# Patient Record
Sex: Female | Born: 1985
Health system: Southern US, Community
[De-identification: ages and names within clinical notes are randomized; demographics above are authoritative.]

## PROBLEM LIST (undated history)

## (undated) DIAGNOSIS — M3500 Sicca syndrome, unspecified: Secondary | ICD-10-CM

## (undated) DIAGNOSIS — Z789 Other specified health status: Secondary | ICD-10-CM

## (undated) DIAGNOSIS — E559 Vitamin D deficiency, unspecified: Secondary | ICD-10-CM

## (undated) DIAGNOSIS — O479 False labor, unspecified: Secondary | ICD-10-CM

## (undated) HISTORY — DX: Vitamin D deficiency, unspecified: E55.9

## (undated) HISTORY — DX: Sjogren syndrome, unspecified: M35.00

---

## 2006-11-17 ENCOUNTER — Encounter (INDEPENDENT_AMBULATORY_CARE_PROVIDER_SITE_OTHER): Payer: Self-pay | Admitting: *Deleted

## 2006-11-17 LAB — CONVERTED CEMR LAB

## 2007-04-01 ENCOUNTER — Ambulatory Visit: Payer: Self-pay | Admitting: Family Medicine

## 2007-04-01 ENCOUNTER — Encounter (INDEPENDENT_AMBULATORY_CARE_PROVIDER_SITE_OTHER): Payer: Self-pay | Admitting: *Deleted

## 2007-04-01 DIAGNOSIS — J301 Allergic rhinitis due to pollen: Secondary | ICD-10-CM | POA: Insufficient documentation

## 2007-04-01 DIAGNOSIS — N390 Urinary tract infection, site not specified: Secondary | ICD-10-CM | POA: Insufficient documentation

## 2007-04-09 ENCOUNTER — Encounter (INDEPENDENT_AMBULATORY_CARE_PROVIDER_SITE_OTHER): Payer: Self-pay | Admitting: Family Medicine

## 2008-11-18 ENCOUNTER — Ambulatory Visit: Payer: Self-pay | Admitting: Family Medicine

## 2008-11-18 LAB — CONVERTED CEMR LAB: Rapid Strep: NEGATIVE

## 2009-05-14 ENCOUNTER — Ambulatory Visit: Payer: Self-pay | Admitting: Family Medicine

## 2009-05-14 DIAGNOSIS — L255 Unspecified contact dermatitis due to plants, except food: Secondary | ICD-10-CM | POA: Insufficient documentation

## 2009-11-02 ENCOUNTER — Ambulatory Visit (HOSPITAL_COMMUNITY): Admission: RE | Admit: 2009-11-02 | Discharge: 2009-11-02 | Payer: Self-pay | Admitting: Obstetrics and Gynecology

## 2009-12-25 ENCOUNTER — Inpatient Hospital Stay (HOSPITAL_COMMUNITY): Admission: AD | Admit: 2009-12-25 | Discharge: 2009-12-29 | Payer: Self-pay | Admitting: Obstetrics and Gynecology

## 2009-12-26 ENCOUNTER — Encounter (INDEPENDENT_AMBULATORY_CARE_PROVIDER_SITE_OTHER): Payer: Self-pay | Admitting: Obstetrics and Gynecology

## 2011-03-05 LAB — CBC
HCT: 31.3 % — ABNORMAL LOW (ref 36.0–46.0)
HCT: 36.3 % (ref 36.0–46.0)
Hemoglobin: 10.6 g/dL — ABNORMAL LOW (ref 12.0–15.0)
Hemoglobin: 12.1 g/dL (ref 12.0–15.0)
MCHC: 33.8 g/dL (ref 30.0–36.0)
MCV: 88.9 fL (ref 78.0–100.0)
Platelets: 158 K/uL (ref 150–400)
RBC: 3.52 MIL/uL — ABNORMAL LOW (ref 3.87–5.11)
RBC: 4.09 MIL/uL (ref 3.87–5.11)
RDW: 13 % (ref 11.5–15.5)
WBC: 12.5 K/uL — ABNORMAL HIGH (ref 4.0–10.5)
WBC: 14.6 10*3/uL — ABNORMAL HIGH (ref 4.0–10.5)

## 2011-03-05 LAB — COMPREHENSIVE METABOLIC PANEL
ALT: 14 U/L (ref 0–35)
AST: 20 U/L (ref 0–37)
Albumin: 2.6 g/dL — ABNORMAL LOW (ref 3.5–5.2)
Alkaline Phosphatase: 86 U/L (ref 39–117)
CO2: 22 mEq/L (ref 19–32)
Calcium: 9.2 mg/dL (ref 8.4–10.5)
Creatinine, Ser: 0.46 mg/dL (ref 0.4–1.2)
GFR calc Af Amer: >60 mL/min (ref >60)
Glucose, Bld: 82 mg/dL (ref 70–99)
Potassium: 3.8 mEq/L (ref 3.5–5.1)
Total Bilirubin: 0.4 mg/dL (ref 0.3–1.2)
Total Protein: 6.9 g/dL (ref 6.0–8.3)

## 2011-03-05 LAB — URINALYSIS, ROUTINE W REFLEX MICROSCOPIC
Bilirubin Urine: NEGATIVE
Glucose, UA: NEGATIVE mg/dL
Hgb urine dipstick: NEGATIVE
Ketones, ur: NEGATIVE mg/dL
Nitrite: NEGATIVE
Protein, ur: NEGATIVE mg/dL
Specific Gravity, Urine: 1.01 (ref 1.005–1.030)
Urobilinogen, UA: 0.2 mg/dL (ref 0.0–1.0)
pH: 7 (ref 5.0–8.0)

## 2011-03-05 LAB — URIC ACID: Uric Acid, Serum: 5 mg/dL (ref 2.4–7.0)

## 2011-06-01 LAB — RPR: RPR: NONREACTIVE

## 2011-06-01 LAB — ANTIBODY SCREEN: Antibody Screen: NEGATIVE

## 2011-06-15 ENCOUNTER — Other Ambulatory Visit: Payer: Self-pay | Admitting: Certified Nurse Midwife

## 2011-11-19 ENCOUNTER — Encounter (HOSPITAL_COMMUNITY): Payer: Self-pay | Admitting: *Deleted

## 2011-11-19 ENCOUNTER — Observation Stay (HOSPITAL_COMMUNITY)
Admission: AD | Admit: 2011-11-19 | Discharge: 2011-11-21 | Disposition: A | Discharge status: Home / Self Care | Payer: BC Managed Care – PPO | Source: Ambulatory Visit | Attending: Obstetrics and Gynecology | Admitting: Obstetrics and Gynecology

## 2011-11-19 DIAGNOSIS — O479 False labor, unspecified: Secondary | ICD-10-CM | POA: Diagnosis present

## 2011-11-19 DIAGNOSIS — O4700 False labor before 37 completed weeks of gestation, unspecified trimester: Principal | ICD-10-CM | POA: Insufficient documentation

## 2011-11-19 DIAGNOSIS — O99019 Anemia complicating pregnancy, unspecified trimester: Secondary | ICD-10-CM | POA: Insufficient documentation

## 2011-11-19 DIAGNOSIS — O34219 Maternal care for unspecified type scar from previous cesarean delivery: Secondary | ICD-10-CM | POA: Insufficient documentation

## 2011-11-19 DIAGNOSIS — D509 Iron deficiency anemia, unspecified: Secondary | ICD-10-CM | POA: Insufficient documentation

## 2011-11-19 HISTORY — DX: Other specified health status: Z78.9

## 2011-11-19 HISTORY — DX: False labor, unspecified: O47.9

## 2011-11-19 HISTORY — DX: False labor before 37 completed weeks of gestation, unspecified trimester: O47.00

## 2011-11-19 LAB — URINALYSIS, ROUTINE W REFLEX MICROSCOPIC
Bilirubin Urine: NEGATIVE
Glucose, UA: NEGATIVE mg/dL
Hgb urine dipstick: NEGATIVE
Ketones, ur: NEGATIVE mg/dL

## 2011-11-19 MED ORDER — LACTATED RINGERS IV SOLN
Freq: Once | INTRAVENOUS | Status: AC
  Administered 2011-11-19: via INTRAVENOUS

## 2011-11-19 MED ORDER — BETAMETHASONE SOD PHOS & ACET 6 (3-3) MG/ML IJ SUSP
12.0000 mg | INTRAMUSCULAR | Status: AC
  Administered 2011-11-20 (×2): 12 mg via INTRAMUSCULAR
  Filled 2011-11-19 (×2): qty 2

## 2011-11-19 MED ORDER — NIFEDIPINE 10 MG PO CAPS
20.0000 mg | ORAL_CAPSULE | Freq: Once | ORAL | Status: DC
  Administered 2011-11-19: 20 mg via ORAL
  Filled 2011-11-19: qty 2

## 2011-11-19 MED ORDER — ACETAMINOPHEN 325 MG PO TABS: 650.0000 mg | ORAL_TABLET | ORAL | Status: DC | PRN

## 2011-11-19 MED ORDER — NIFEDIPINE 10 MG PO CAPS
10.0000 mg | ORAL_CAPSULE | Freq: Four times a day (QID) | ORAL | Status: DC
  Filled 2011-11-19: qty 1

## 2011-11-19 MED ORDER — DOCUSATE SODIUM 100 MG PO CAPS
100.0000 mg | ORAL_CAPSULE | Freq: Every day | ORAL | Status: DC
  Administered 2011-11-20 – 2011-11-21 (×2): 100 mg via ORAL
  Filled 2011-11-19 (×3): qty 1

## 2011-11-19 MED ORDER — PRENATAL PLUS 27-1 MG PO TABS
1.0000 | ORAL_TABLET | Freq: Every day | ORAL | Status: DC
  Administered 2011-11-20 – 2011-11-21 (×2): 1 via ORAL
  Filled 2011-11-19 (×3): qty 1

## 2011-11-19 MED ORDER — ZOLPIDEM TARTRATE 10 MG PO TABS: 10.0000 mg | ORAL_TABLET | Freq: Every evening | ORAL | Status: DC | PRN

## 2011-11-19 MED ORDER — CALCIUM CARBONATE ANTACID 500 MG PO CHEW
2.0000 | CHEWABLE_TABLET | ORAL | Status: DC | PRN
  Filled 2011-11-19: qty 2

## 2011-11-19 NOTE — Progress Notes (Signed)
Pt reports contractions off/on for 2 days, tonight they were regular. G2P1( C/S for breech).

## 2011-11-19 NOTE — H&P (Signed)
  OB ADMISSION/ HISTORY & PHYSICAL:  Admission Date: 11/19/2011 10:35 PM  Admit Diagnosis: IUP at 30w 4d  Preterm contractions  Hx preterm birth 76 wks - C/S for breech  Veronica Hayes is a 25 y.o. female presenting for contractions which started earlier today, noted Q 4-8 min at home. Also w/ bout of diarrhea x 3 today, denies cramping / abdominal pain. States similar symptoms with previous preterm labor. On 17P weekly. Cvx at [redacted]w[redacted]d was closed and thick.   Prenatal History: G2P0101  EDC : 01/24/2012,   Prenatal care at [facility] since [redacted] weeks gestation  Prenatal course complicated by hx preterm birth and previous cesarean for breech  Prenatal Labs: ABO, Rh:   O pos Antibody:  neg Rubella:   immune RPR:   neg HBsAg:   neg HIV:   neg GBS:   unknown 1 hr Glucola : 112   Medical / Surgical History :  Past medical history:  Past Medical History  Diagnosis Date  . Preterm contractions 11/19/2011     Past surgical history: No past surgical history on file.   Family History: No family history on file.   Social History:  does not have a smoking history on file. She does not have any smokeless tobacco history on file. Her alcohol and drug histories not on file.   Allergies: Review of patient's allergies indicates no known allergies.    Current Medications at time of admission:  Current facility-administered medications:acetaminophen (TYLENOL) tablet 650 mg, 650 mg, Oral, Q4H PRN, Arlan Organ;  calcium carbonate (TUMS - dosed in mg elemental calcium) chewable tablet 400 mg of elemental calcium, 2 tablet, Oral, Q4H PRN, Arlan Organ;  docusate sodium (COLACE) capsule 100 mg, 100 mg, Oral, Daily, Arlan Organ;  lactated ringers infusion, , Intravenous, Once, Arlan Organ NIFEdipine (PROCARDIA) capsule 10 mg, 10 mg, Oral, Q6H, Arlan Organ;  NIFEdipine (PROCARDIA) capsule 20 mg, 20 mg, Oral, Once, Arlan Organ;  prenatal vitamin w/FE, FA (PRENATAL 1 + 1) 27-1 MG  tablet 1 tablet, 1 tablet, Oral, Daily, Arlan Organ;  zolpidem (AMBIEN) tablet 10 mg, 10 mg, Oral, QHS PRN, Arlan Organ    Review of Systems: ROS: as noted  Physical Exam:  Dilation: 1 Effacement (%): Thick Station: Ballotable Exam by::  Colon Flattery CNM Filed Vitals:   11/19/11 2243 11/19/11 2306  BP: 138/84 134/87  Pulse: 93 97  Temp: 99.4 F (37.4 C)   Resp: 18 18  Height: 5\' 3"  (1.6 m)   Weight: 68.947 kg (152 lb)    General: AAO X 3, NAD Heart: RRR Lungs:CTAB Abdomen:NT, gravid Extremities:no edema Genitalia / VE:as noted, fFN collected FHR: 130, (+) accel's, no decel's, moderate variability TOCO: Q3-4 min, x 50 sec, mild     Assessment: IUP at [redacted]w[redacted]d Preterm contractions with change in cervix UA negative, not c/w UTI fFN pending Plan:  Admit to antepartum for IV hydration and tocolytic given previous hx of PTB Betamethasone per protocol Procardia   Consult Dr Billy Coast - agrees  [facility] 11/19/2011, 11:35 PM

## 2011-11-20 ENCOUNTER — Inpatient Hospital Stay (HOSPITAL_COMMUNITY): Payer: BC Managed Care – PPO

## 2011-11-20 ENCOUNTER — Encounter (HOSPITAL_COMMUNITY): Payer: Self-pay | Admitting: *Deleted

## 2011-11-20 DIAGNOSIS — O34219 Maternal care for unspecified type scar from previous cesarean delivery: Secondary | ICD-10-CM | POA: Diagnosis present

## 2011-11-20 LAB — FETAL FIBRONECTIN: Fetal Fibronectin: NEGATIVE

## 2011-11-20 MED ORDER — NIFEDIPINE 10 MG PO CAPS
20.0000 mg | ORAL_CAPSULE | Freq: Four times a day (QID) | ORAL | Status: DC
  Administered 2011-11-20 – 2011-11-21 (×4): 20 mg via ORAL
  Filled 2011-11-20 (×4): qty 2

## 2011-11-20 MED ORDER — INDOMETHACIN 25 MG PO CAPS
25.0000 mg | ORAL_CAPSULE | Freq: Four times a day (QID) | ORAL | Status: DC
  Administered 2011-11-20 – 2011-11-21 (×4): 25 mg via ORAL
  Filled 2011-11-20 (×5): qty 1

## 2011-11-20 MED ORDER — LACTATED RINGERS IV SOLN
INTRAVENOUS | Status: DC
  Administered 2011-11-20 – 2011-11-21 (×4): via INTRAVENOUS

## 2011-11-20 MED ORDER — INDOMETHACIN 50 MG RE SUPP
50.0000 mg | Freq: Once | RECTAL | Status: DC
  Filled 2011-11-20: qty 1

## 2011-11-20 MED ORDER — NIFEDIPINE 10 MG PO CAPS
10.0000 mg | ORAL_CAPSULE | Freq: Four times a day (QID) | ORAL | Status: DC
  Administered 2011-11-20: 10 mg via ORAL

## 2011-11-20 MED ORDER — INDOMETHACIN 25 MG PO CAPS
25.0000 mg | ORAL_CAPSULE | Freq: Four times a day (QID) | ORAL | Status: DC
  Filled 2011-11-20: qty 1

## 2011-11-20 MED ORDER — INDOMETHACIN 50 MG RE SUPP
50.0000 mg | Freq: Once | RECTAL | Status: AC
  Administered 2011-11-20: 50 mg via RECTAL
  Filled 2011-11-20: qty 1

## 2011-11-20 MED ORDER — LACTATED RINGERS IV BOLUS (SEPSIS)
500.0000 mL | Freq: Once | INTRAVENOUS | Status: AC
  Administered 2011-11-20: 500 mL via INTRAVENOUS

## 2011-11-20 NOTE — Progress Notes (Signed)
Encouraged pt to void d/t regular UC

## 2011-11-20 NOTE — Progress Notes (Signed)
UR chart review completed.  

## 2011-11-20 NOTE — Progress Notes (Signed)
Patient ID: Veronica Hayes, female   DOB: 1986-11-09, 25 y.o.   MRN: [id number] S: Pt feels contractions. No bleeding or Leakage of fluid. No ROM. Slept intermittently.  O:VSS- afebrile Lungs: CTA CV: RRR Abd: Gravid, NT Neg CVAT Ext: Neg c/c/e Neuro: nonfocal Skin: intact Pelvic: 1/60/-2  FHR 150s with 15x15 accels No decels, Contractions every 5-7 min Category I tracing  A: 30 5/7 weeks IUP H/O PTD on 17OHP PTL - active contractions but no cervical change. FFN neg Previous Cesarean Section for rpt  P: Increase Procardia to 20 mg q 6 hours Add Indocin MgSO4 for Neuroprophylaxis Betamethasone #2 tonight Continuous EFM and Toco

## 2011-11-21 MED ORDER — NIFEDIPINE ER 30 MG PO TB24
30.0000 mg | ORAL_TABLET | Freq: Two times a day (BID) | ORAL | Status: DC
  Administered 2011-11-21: 30 mg via ORAL
  Filled 2011-11-21: qty 1

## 2011-11-21 MED ORDER — NIFEDIPINE ER 30 MG PO TB24: 30.0000 mg | ORAL_TABLET | Freq: Two times a day (BID) | ORAL | Status: DC

## 2011-11-21 NOTE — Progress Notes (Signed)
Pt feeling lightheaded

## 2011-11-21 NOTE — Progress Notes (Signed)
Patient ID: Veronica Hayes, female   DOB: 12-Jan-1986, 25 y.o.   MRN: [id number] S: Slept well, occasional ctx's still felt but v. Mild. Denies LOF / VB. Good FM.   O: BP 118/61  Pulse 102  Temp(Src) 98.3 F (36.8 C) (Oral)  Resp 18  Ht 5\' 3"  (1.6 m)  Wt 68.947 kg (152 lb)  BMI 26.93 kg/m2  Breastfeeding? Unknown   WUJ:WJXBJYNW: 125 bpm, Variability: Good {> 6 bpm), Accelerations: Reactive and Decelerations: Absent Toco: Frequency: 2-3 times per hour, Duration: 45-50 seconds and Intensity: mild SVE: deferred Sono w/ good interval growth, nl AFI, CL 5+ cm  A/P- 25 y.o. admitted with preterm contraction  Hx PTB at 34 wks, on 17OHP Hx C/S - breech, for repeat  Preterm labor management: IV hydration, nifedipine, indomethacin x 24 hrs Dating:  [redacted]w[redacted]d PNL Needed:  GBS cx pending FWB:  Category 1 PTL:  No progressive cervical change C/W Dr. Billy Coast Plan D/C home D/C indomethacin Procardia XL 30 mg PO BID Continue weekly 17OHP inj PTL precautions reviewed Follow closely in office  ROD: C-section with labor ("U" extension on uterus with prior C/S)  PAUL,DANIELA  11/21/2011 10:15 AM

## 2011-11-21 NOTE — Progress Notes (Signed)
Patient ID: Veronica Hayes, female   DOB: 1986/03/24, 25 y.o.   MRN: [id number] :S: Pt not feeling contractions.  No bleeding or Leakage of fluid. No ROM.  Slept well. Good FM  O:VSS- afebrile  Lungs: CTA  CV: RRR  Abd: Gravid, NT  Neg CVAT  Ext: Neg c/c/e  Neuro: nonfocal  Skin: intact  Pelvic: deferred  FHR 150s with 15x15 accels  No decels, Contractions every 5-10 min and irregular Category I tracing  A:  30 6/7 weeks IUP  H/O PTD on 17OHP . BMZ complete. PTL - active contractions but no cervical change. FFN neg  Previous Cesarean Section for rpt   P:  Change to Procardia XL DC home today. DC IV today. Weekly 17P Short term fu.

## 2011-11-21 NOTE — Discharge Summary (Signed)
Obstetric Discharge Summary Reason for Admission: observation/evaluation, 30w 6d preterm contractions Prenatal Procedures: ultrasound and 17OHP injections weekly Hospital course: Tocolytics given - Procardia and Indomethacin. Continuous EFM.  IV hydration. Betamethasone course completed. No progressive cervical change. Fetal fibronectin negative.    Hemoglobin  Date Value Range Status  12/27/2009 10.6* 12.0-15.0 (g/dL) Final     HCT  Date Value Range Status  12/27/2009 31.3* 36.0-46.0 (%) Final  GBS cx pending  Discharge Diagnoses: [redacted]w[redacted]d gest, preterm contractions,  Iron deficiency anemia of pregnancy - mild   Discharge Information: Date: 11/21/2011 Activity: increased rest at home Diet: routine, increase iron rich foods in diet Medications: PNV and Procardia XL Condition: stable Instructions: Continue 17OHP injections weekly, PTL precautions Discharge to: home Follow-up Information    Follow up with BAILEY,TANYA in 3 days.   Contact information:   [address] [place] [address] [phone]            Khadejah Son 11/21/2011, 10:19 AM

## 2011-11-22 LAB — CULTURE, BETA STREP (GROUP B ONLY)

## 2011-12-12 ENCOUNTER — Inpatient Hospital Stay (HOSPITAL_COMMUNITY): Payer: BC Managed Care – PPO

## 2011-12-12 ENCOUNTER — Inpatient Hospital Stay (HOSPITAL_COMMUNITY)
Admission: AD | Admit: 2011-12-12 | Discharge: 2011-12-12 | Disposition: A | Discharge status: Home / Self Care | Payer: BC Managed Care – PPO | Source: Ambulatory Visit | Attending: Obstetrics and Gynecology | Admitting: Obstetrics and Gynecology

## 2011-12-12 ENCOUNTER — Encounter (HOSPITAL_COMMUNITY): Payer: Self-pay | Admitting: *Deleted

## 2011-12-12 DIAGNOSIS — O4700 False labor before 37 completed weeks of gestation, unspecified trimester: Secondary | ICD-10-CM | POA: Insufficient documentation

## 2011-12-12 DIAGNOSIS — Z364 Encounter for antenatal screening for fetal growth retardation: Secondary | ICD-10-CM

## 2011-12-12 LAB — URINALYSIS, ROUTINE W REFLEX MICROSCOPIC
Bilirubin Urine: NEGATIVE
Ketones, ur: NEGATIVE mg/dL
Nitrite: NEGATIVE
Specific Gravity, Urine: 1.015 (ref 1.005–1.030)
Urobilinogen, UA: 0.2 mg/dL (ref 0.0–1.0)
pH: 7.5 (ref 5.0–8.0)

## 2011-12-12 LAB — URINE MICROSCOPIC-ADD ON

## 2011-12-12 MED ORDER — NIFEDIPINE 10 MG PO CAPS
10.0000 mg | ORAL_CAPSULE | Freq: Once | ORAL | Status: AC
  Administered 2011-12-12: 10 mg via ORAL
  Filled 2011-12-12: qty 2

## 2011-12-12 MED ORDER — NIFEDIPINE 10 MG PO CAPS
10.0000 mg | ORAL_CAPSULE | Freq: Once | ORAL | Status: AC
  Administered 2011-12-12: 10 mg via ORAL

## 2011-12-12 NOTE — ED Provider Notes (Signed)
History    25 yo G2P0101 MWF hx prev C/S now @ 33 6/[redacted] wk gestation presents with c/o painful reg cycle since 7 pm. Intact membrane. (-) vaginal bleeding. On 17OHP injection weekly. S/p admit 2 wk ago for PTL and given BMZ as well as med daily Chief Complaint  Patient presents with  . Contractions   HPI  OB History    Grav Para Term Preterm Abortions TAB SAB Ect Mult Living   2 1              Past Medical History  Diagnosis Date  . Preterm contractions 11/19/2011  . No pertinent past medical history   . Preterm labor     12/2009 34wks    Past Surgical History  Procedure Date  . Cesarean section     2011    History reviewed. No pertinent family history.  History  Substance Use Topics  . Smoking status: Never Smoker   . Smokeless tobacco: Never Used  . Alcohol Use: No    Allergies: No Known Allergies  Prescriptions prior to admission  Medication Sig Dispense Refill  . hydroxyprogesterone caproate (DELALUTIN) 250 mg/mL OIL Inject 250 mg into the muscle once a week.        Marland Kitchen NIFEdipine (PROCARDIA-XL/ADALAT CC) 30 MG 24 hr tablet Take 1 tablet (30 mg total) by mouth 2 (two) times daily.  60 tablet  0  . prenatal vitamin w/FE, FA (PRENATAL 1 + 1) 27-1 MG TABS Take 1 tablet by mouth daily.          ROS(+) ctx Physical Exam   Blood pressure 123/70, pulse 88, temperature 98.7 F (37.1 C), temperature source Oral, resp. rate 20, height 5\' 3"  (1.6 m), weight 70.308 kg (155 lb), unknown if currently breastfeeding.  Physical Exam  Constitutional: She is oriented to person, place, and time. She appears well-developed and well-nourished.  Eyes: EOM are normal.  Cardiovascular: Regular rhythm.   Respiratory: Breath sounds normal.  GI: Soft.       Gravid. Transverse incision  Musculoskeletal: She exhibits no edema.  Neurological: She is alert and oriented to person, place, and time.  Skin: Skin is warm and dry.  Psychiatric: She has a normal mood and affect.   Vulva (-)  lesion Vagina: (+) white d/c (+) mucus @ os Cervix 1.5cm/50/-3 vtx MAU Course  Procedures FFN done. U/a neg. ucx sent Tracing: baseline 130  Small accels NR ctx q q 4-6 mins   Assessment and Plan  PMC/PTL Hx PTB Prev C/S IUP @ 33 6/7 wk betamethasone complete P) FFN.  Procardia. BPP, EFW, cervical length. ucx  Veronica Hayes A 12/12/2011, 5:16 AM

## 2011-12-12 NOTE — Progress Notes (Signed)
Pt states she started contracting around noon yesterday, but since midnight have gotten harder and harder

## 2011-12-13 ENCOUNTER — Encounter (HOSPITAL_COMMUNITY): Payer: Self-pay | Admitting: *Deleted

## 2011-12-13 ENCOUNTER — Encounter (HOSPITAL_COMMUNITY): Payer: Self-pay | Admitting: Anesthesiology

## 2011-12-13 ENCOUNTER — Encounter (HOSPITAL_COMMUNITY)
Admission: AD | Disposition: A | Discharge status: Home / Self Care | Payer: Self-pay | Source: Ambulatory Visit | Attending: Obstetrics & Gynecology

## 2011-12-13 ENCOUNTER — Inpatient Hospital Stay (HOSPITAL_COMMUNITY)
Admission: AD | Admit: 2011-12-13 | Discharge: 2011-12-16 | DRG: 370 | Disposition: A | Discharge status: Home / Self Care | Payer: BC Managed Care – PPO | Source: Ambulatory Visit | Attending: Obstetrics & Gynecology | Admitting: Obstetrics & Gynecology

## 2011-12-13 ENCOUNTER — Inpatient Hospital Stay (HOSPITAL_COMMUNITY): Payer: BC Managed Care – PPO | Admitting: Anesthesiology

## 2011-12-13 ENCOUNTER — Other Ambulatory Visit: Payer: Self-pay | Admitting: Obstetrics and Gynecology

## 2011-12-13 DIAGNOSIS — O479 False labor, unspecified: Secondary | ICD-10-CM

## 2011-12-13 DIAGNOSIS — O34219 Maternal care for unspecified type scar from previous cesarean delivery: Principal | ICD-10-CM | POA: Diagnosis present

## 2011-12-13 DIAGNOSIS — O9903 Anemia complicating the puerperium: Secondary | ICD-10-CM | POA: Diagnosis not present

## 2011-12-13 DIAGNOSIS — D62 Acute posthemorrhagic anemia: Secondary | ICD-10-CM | POA: Diagnosis not present

## 2011-12-13 LAB — CBC
MCH: 29.3 pg (ref 26.0–34.0)
MCHC: 33.6 g/dL (ref 30.0–36.0)
RDW: 13.2 % (ref 11.5–15.5)

## 2011-12-13 LAB — SAMPLE TO BLOOD BANK

## 2011-12-13 LAB — RPR: RPR Ser Ql: NONREACTIVE

## 2011-12-13 LAB — STREP B DNA PROBE

## 2011-12-13 SURGERY — Surgical Case
Anesthesia: Regional | Wound class: Clean Contaminated

## 2011-12-13 MED ORDER — SCOPOLAMINE 1 MG/3DAYS TD PT72
1.0000 | MEDICATED_PATCH | Freq: Once | TRANSDERMAL | Status: AC
  Administered 2011-12-13: 1.5 mg via TRANSDERMAL

## 2011-12-13 MED ORDER — NALBUPHINE HCL 10 MG/ML IJ SOLN: 5.0000 mg | INTRAMUSCULAR | Status: DC | PRN

## 2011-12-13 MED ORDER — KETOROLAC TROMETHAMINE 30 MG/ML IJ SOLN: 30.0000 mg | Freq: Four times a day (QID) | INTRAMUSCULAR | Status: AC | PRN

## 2011-12-13 MED ORDER — HYDROMORPHONE HCL PF 1 MG/ML IJ SOLN: 0.2500 mg | INTRAMUSCULAR | Status: DC | PRN

## 2011-12-13 MED ORDER — SCOPOLAMINE 1 MG/3DAYS TD PT72
MEDICATED_PATCH | TRANSDERMAL | Status: AC
  Administered 2011-12-13: 1.5 mg via TRANSDERMAL
  Filled 2011-12-13: qty 1

## 2011-12-13 MED ORDER — FLEET ENEMA 7-19 GM/118ML RE ENEM: 1.0000 | ENEMA | RECTAL | Status: DC | PRN

## 2011-12-13 MED ORDER — LACTATED RINGERS IV SOLN: INTRAVENOUS | Status: DC

## 2011-12-13 MED ORDER — PRENATAL MULTIVITAMIN CH
1.0000 | ORAL_TABLET | Freq: Every day | ORAL | Status: DC
  Administered 2011-12-14 – 2011-12-16 (×3): 1 via ORAL
  Filled 2011-12-13 (×4): qty 1

## 2011-12-13 MED ORDER — SENNOSIDES-DOCUSATE SODIUM 8.6-50 MG PO TABS
2.0000 | ORAL_TABLET | Freq: Every day | ORAL | Status: DC
  Administered 2011-12-13 – 2011-12-14 (×2): 2 via ORAL

## 2011-12-13 MED ORDER — LIDOCAINE HCL (PF) 1 % IJ SOLN: 30.0000 mL | INTRAMUSCULAR | Status: DC | PRN

## 2011-12-13 MED ORDER — MENTHOL 3 MG MT LOZG: 1.0000 | LOZENGE | OROMUCOSAL | Status: DC | PRN

## 2011-12-13 MED ORDER — ONDANSETRON HCL 4 MG/2ML IJ SOLN: 4.0000 mg | INTRAMUSCULAR | Status: DC | PRN

## 2011-12-13 MED ORDER — PHENYLEPHRINE 40 MCG/ML (10ML) SYRINGE FOR IV PUSH (FOR BLOOD PRESSURE SUPPORT)
PREFILLED_SYRINGE | INTRAVENOUS | Status: AC
  Filled 2011-12-13: qty 5

## 2011-12-13 MED ORDER — OXYTOCIN 20 UNITS IN LACTATED RINGERS INFUSION - SIMPLE
INTRAVENOUS | Status: DC | PRN
  Administered 2011-12-13: 20 [IU] via INTRAVENOUS

## 2011-12-13 MED ORDER — LANOLIN HYDROUS EX OINT: 1.0000 "application " | TOPICAL_OINTMENT | CUTANEOUS | Status: DC | PRN

## 2011-12-13 MED ORDER — PHENYLEPHRINE HCL 10 MG/ML IJ SOLN
INTRAMUSCULAR | Status: DC | PRN
  Administered 2011-12-13: 40 ug via INTRAVENOUS
  Administered 2011-12-13: 80 ug via INTRAVENOUS
  Administered 2011-12-13 (×2): 40 ug via INTRAVENOUS

## 2011-12-13 MED ORDER — MORPHINE SULFATE (PF) 0.5 MG/ML IJ SOLN
INTRAMUSCULAR | Status: DC | PRN
  Administered 2011-12-13: .2 mg via INTRATHECAL

## 2011-12-13 MED ORDER — OXYTOCIN 20 UNITS IN LACTATED RINGERS INFUSION - SIMPLE
INTRAVENOUS | Status: AC
  Administered 2011-12-13: 125 mL/h via INTRAVENOUS
  Filled 2011-12-13: qty 1000

## 2011-12-13 MED ORDER — DIPHENHYDRAMINE HCL 50 MG/ML IJ SOLN: 12.5000 mg | INTRAMUSCULAR | Status: DC | PRN

## 2011-12-13 MED ORDER — KETOROLAC TROMETHAMINE 60 MG/2ML IM SOLN
60.0000 mg | Freq: Once | INTRAMUSCULAR | Status: AC | PRN
  Administered 2011-12-13: 60 mg via INTRAMUSCULAR

## 2011-12-13 MED ORDER — FENTANYL CITRATE 0.05 MG/ML IJ SOLN
INTRAMUSCULAR | Status: AC
  Filled 2011-12-13: qty 2

## 2011-12-13 MED ORDER — IBUPROFEN 600 MG PO TABS: 600.0000 mg | ORAL_TABLET | Freq: Four times a day (QID) | ORAL | Status: DC | PRN

## 2011-12-13 MED ORDER — SODIUM CHLORIDE 0.9 % IJ SOLN: 3.0000 mL | INTRAMUSCULAR | Status: DC | PRN

## 2011-12-13 MED ORDER — MORPHINE SULFATE 0.5 MG/ML IJ SOLN
INTRAMUSCULAR | Status: AC
  Filled 2011-12-13: qty 10

## 2011-12-13 MED ORDER — ONDANSETRON HCL 4 MG/2ML IJ SOLN: 4.0000 mg | Freq: Three times a day (TID) | INTRAMUSCULAR | Status: DC | PRN

## 2011-12-13 MED ORDER — CEFAZOLIN SODIUM 1-5 GM-% IV SOLN
1.0000 g | Freq: Once | INTRAVENOUS | Status: AC
  Administered 2011-12-13: 1 g via INTRAVENOUS
  Filled 2011-12-13: qty 50

## 2011-12-13 MED ORDER — OXYTOCIN 20 UNITS IN LACTATED RINGERS INFUSION - SIMPLE
125.0000 mL/h | INTRAVENOUS | Status: AC
  Administered 2011-12-13: 125 mL/h via INTRAVENOUS
  Filled 2011-12-13: qty 1000

## 2011-12-13 MED ORDER — ONDANSETRON HCL 4 MG PO TABS: 4.0000 mg | ORAL_TABLET | ORAL | Status: DC | PRN

## 2011-12-13 MED ORDER — ZOLPIDEM TARTRATE 5 MG PO TABS: 5.0000 mg | ORAL_TABLET | Freq: Every evening | ORAL | Status: DC | PRN

## 2011-12-13 MED ORDER — IBUPROFEN 600 MG PO TABS
600.0000 mg | ORAL_TABLET | Freq: Four times a day (QID) | ORAL | Status: DC
  Administered 2011-12-13 – 2011-12-16 (×11): 600 mg via ORAL
  Filled 2011-12-13 (×11): qty 1

## 2011-12-13 MED ORDER — NALOXONE HCL 0.4 MG/ML IJ SOLN: 0.4000 mg | INTRAMUSCULAR | Status: DC | PRN

## 2011-12-13 MED ORDER — METHYLERGONOVINE MALEATE 0.2 MG/ML IJ SOLN: 0.2000 mg | INTRAMUSCULAR | Status: DC | PRN

## 2011-12-13 MED ORDER — TETANUS-DIPHTH-ACELL PERTUSSIS 5-2.5-18.5 LF-MCG/0.5 IM SUSP
0.5000 mL | Freq: Once | INTRAMUSCULAR | Status: DC
  Filled 2011-12-13: qty 0.5

## 2011-12-13 MED ORDER — FENTANYL CITRATE 0.05 MG/ML IJ SOLN
INTRAMUSCULAR | Status: DC | PRN
  Administered 2011-12-13: 25 ug via INTRATHECAL

## 2011-12-13 MED ORDER — ONDANSETRON HCL 4 MG/2ML IJ SOLN: 4.0000 mg | Freq: Four times a day (QID) | INTRAMUSCULAR | Status: DC | PRN

## 2011-12-13 MED ORDER — SIMETHICONE 80 MG PO CHEW
80.0000 mg | CHEWABLE_TABLET | Freq: Three times a day (TID) | ORAL | Status: DC
  Administered 2011-12-13 – 2011-12-16 (×7): 80 mg via ORAL

## 2011-12-13 MED ORDER — PROMETHAZINE HCL 25 MG/ML IJ SOLN: 6.2500 mg | INTRAMUSCULAR | Status: DC | PRN

## 2011-12-13 MED ORDER — METHYLERGONOVINE MALEATE 0.2 MG PO TABS: 0.2000 mg | ORAL_TABLET | ORAL | Status: DC | PRN

## 2011-12-13 MED ORDER — OXYCODONE-ACETAMINOPHEN 5-325 MG PO TABS
1.0000 | ORAL_TABLET | ORAL | Status: DC | PRN
  Administered 2011-12-13 – 2011-12-16 (×6): 1 via ORAL
  Filled 2011-12-13 (×6): qty 1

## 2011-12-13 MED ORDER — ONDANSETRON HCL 4 MG/2ML IJ SOLN
INTRAMUSCULAR | Status: AC
  Filled 2011-12-13: qty 2

## 2011-12-13 MED ORDER — MEPERIDINE HCL 25 MG/ML IJ SOLN: 6.2500 mg | INTRAMUSCULAR | Status: DC | PRN

## 2011-12-13 MED ORDER — DIPHENHYDRAMINE HCL 25 MG PO CAPS: 25.0000 mg | ORAL_CAPSULE | Freq: Four times a day (QID) | ORAL | Status: DC | PRN

## 2011-12-13 MED ORDER — KETOROLAC TROMETHAMINE 60 MG/2ML IM SOLN
INTRAMUSCULAR | Status: AC
  Administered 2011-12-13: 60 mg via INTRAMUSCULAR
  Filled 2011-12-13: qty 2

## 2011-12-13 MED ORDER — WITCH HAZEL-GLYCERIN EX PADS: 1.0000 "application " | MEDICATED_PAD | CUTANEOUS | Status: DC | PRN

## 2011-12-13 MED ORDER — ACETAMINOPHEN 325 MG PO TABS: 650.0000 mg | ORAL_TABLET | ORAL | Status: DC | PRN

## 2011-12-13 MED ORDER — KETOROLAC TROMETHAMINE 30 MG/ML IJ SOLN: 15.0000 mg | Freq: Once | INTRAMUSCULAR | Status: DC | PRN

## 2011-12-13 MED ORDER — DIPHENHYDRAMINE HCL 50 MG/ML IJ SOLN: 25.0000 mg | INTRAMUSCULAR | Status: DC | PRN

## 2011-12-13 MED ORDER — DIPHENHYDRAMINE HCL 25 MG PO CAPS
25.0000 mg | ORAL_CAPSULE | ORAL | Status: DC | PRN
  Administered 2011-12-13: 25 mg via ORAL
  Filled 2011-12-13: qty 1

## 2011-12-13 MED ORDER — CITRIC ACID-SODIUM CITRATE 334-500 MG/5ML PO SOLN: 30.0000 mL | ORAL | Status: DC | PRN

## 2011-12-13 MED ORDER — CITRIC ACID-SODIUM CITRATE 334-500 MG/5ML PO SOLN
30.0000 mL | Freq: Once | ORAL | Status: AC
  Administered 2011-12-13: 30 mL via ORAL
  Filled 2011-12-13: qty 15

## 2011-12-13 MED ORDER — OXYTOCIN 10 UNIT/ML IJ SOLN
INTRAMUSCULAR | Status: AC
  Filled 2011-12-13: qty 2

## 2011-12-13 MED ORDER — SODIUM CHLORIDE 0.9 % IV SOLN: 1.0000 ug/kg/h | INTRAVENOUS | Status: DC | PRN

## 2011-12-13 MED ORDER — LACTATED RINGERS IV SOLN
INTRAVENOUS | Status: DC
  Administered 2011-12-13: 09:00:00 via INTRAVENOUS

## 2011-12-13 MED ORDER — OXYTOCIN BOLUS FROM INFUSION
500.0000 mL | Freq: Once | INTRAVENOUS | Status: DC
  Filled 2011-12-13: qty 500

## 2011-12-13 MED ORDER — FAMOTIDINE IN NACL 20-0.9 MG/50ML-% IV SOLN
20.0000 mg | Freq: Once | INTRAVENOUS | Status: AC
  Administered 2011-12-13: 20 mg via INTRAVENOUS
  Filled 2011-12-13: qty 50

## 2011-12-13 MED ORDER — SIMETHICONE 80 MG PO CHEW
80.0000 mg | CHEWABLE_TABLET | ORAL | Status: DC | PRN
  Administered 2011-12-14: 80 mg via ORAL

## 2011-12-13 MED ORDER — BUPIVACAINE IN DEXTROSE 0.75-8.25 % IT SOLN
INTRATHECAL | Status: DC | PRN
  Administered 2011-12-13: 10.5 mg via INTRATHECAL

## 2011-12-13 MED ORDER — OXYTOCIN 10 UNIT/ML IJ SOLN: 10.0000 [IU] | Freq: Once | INTRAMUSCULAR | Status: DC

## 2011-12-13 MED ORDER — DIBUCAINE 1 % RE OINT: 1.0000 "application " | TOPICAL_OINTMENT | RECTAL | Status: DC | PRN

## 2011-12-13 MED ORDER — LACTATED RINGERS IV BOLUS (SEPSIS)
1000.0000 mL | Freq: Once | INTRAVENOUS | Status: AC
  Administered 2011-12-13 (×2): 1000 mL via INTRAVENOUS

## 2011-12-13 MED ORDER — OXYTOCIN 20 UNITS IN LACTATED RINGERS INFUSION - SIMPLE: 125.0000 mL/h | Freq: Once | INTRAVENOUS | Status: DC

## 2011-12-13 MED ORDER — BUPIVACAINE HCL (PF) 0.25 % IJ SOLN
INTRAMUSCULAR | Status: DC | PRN
  Administered 2011-12-13: 10 mL

## 2011-12-13 MED ORDER — TERBUTALINE SULFATE 1 MG/ML IJ SOLN
0.2500 mg | Freq: Once | INTRAMUSCULAR | Status: AC
  Administered 2011-12-13: 0.25 mg via SUBCUTANEOUS
  Filled 2011-12-13: qty 1

## 2011-12-13 MED ORDER — OXYCODONE-ACETAMINOPHEN 5-325 MG PO TABS: 2.0000 | ORAL_TABLET | ORAL | Status: DC | PRN

## 2011-12-13 MED ORDER — LACTATED RINGERS IV SOLN: 500.0000 mL | INTRAVENOUS | Status: DC | PRN

## 2011-12-13 MED ORDER — ONDANSETRON HCL 4 MG/2ML IJ SOLN
INTRAMUSCULAR | Status: DC | PRN
  Administered 2011-12-13: 4 mg via INTRAVENOUS

## 2011-12-13 SURGICAL SUPPLY — 30 items
CLOTH BEACON ORANGE TIMEOUT ST (SAFETY) ×2 IMPLANT
CONTAINER PREFILL 10% NBF 15ML (MISCELLANEOUS) IMPLANT
DRESSING TELFA 8X3 (GAUZE/BANDAGES/DRESSINGS) ×2 IMPLANT
DRSG PAD ABDOMINAL 8X10 ST (GAUZE/BANDAGES/DRESSINGS) ×2 IMPLANT
ELECT REM PT RETURN 9FT ADLT (ELECTROSURGICAL) ×2
ELECTRODE REM PT RTRN 9FT ADLT (ELECTROSURGICAL) ×1 IMPLANT
EXTRACTOR VACUUM M CUP 4 TUBE (SUCTIONS) IMPLANT
GAUZE SPONGE 4X4 12PLY STRL LF (GAUZE/BANDAGES/DRESSINGS) ×2 IMPLANT
GLOVE BIO SURGEON STRL SZ7.5 (GLOVE) ×4 IMPLANT
GOWN PREVENTION PLUS LG XLONG (DISPOSABLE) ×4 IMPLANT
GOWN PREVENTION PLUS XLARGE (GOWN DISPOSABLE) ×2 IMPLANT
KIT ABG SYR 3ML LUER SLIP (SYRINGE) IMPLANT
NEEDLE HYPO 25X1 1.5 SAFETY (NEEDLE) ×2 IMPLANT
NEEDLE HYPO 25X5/8 SAFETYGLIDE (NEEDLE) IMPLANT
NS IRRIG 1000ML POUR BTL (IV SOLUTION) ×2 IMPLANT
PACK C SECTION WH (CUSTOM PROCEDURE TRAY) ×2 IMPLANT
PAD ABD 7.5X8 STRL (GAUZE/BANDAGES/DRESSINGS) IMPLANT
SLEEVE SCD COMPRESS KNEE MED (MISCELLANEOUS) ×2 IMPLANT
STAPLER VISISTAT 35W (STAPLE) ×2 IMPLANT
SUT MNCRL 0 VIOLET CTX 36 (SUTURE) ×2 IMPLANT
SUT MON AB 2-0 CT1 27 (SUTURE) ×2 IMPLANT
SUT MON AB-0 CT1 36 (SUTURE) ×4 IMPLANT
SUT MONOCRYL 0 CTX 36 (SUTURE) ×2
SUT PLAIN 0 NONE (SUTURE) IMPLANT
SUT PLAIN 2 0 XLH (SUTURE) IMPLANT
SYR CONTROL 10ML LL (SYRINGE) ×2 IMPLANT
TAPE CLOTH SURG 4X10 WHT LF (GAUZE/BANDAGES/DRESSINGS) ×2 IMPLANT
TOWEL OR 17X24 6PK STRL BLUE (TOWEL DISPOSABLE) ×4 IMPLANT
TRAY FOLEY CATH 14FR (SET/KITS/TRAYS/PACK) ×2 IMPLANT
WATER STERILE IRR 1000ML POUR (IV SOLUTION) ×2 IMPLANT

## 2011-12-13 NOTE — Anesthesia Procedure Notes (Signed)
Spinal  Patient location during procedure: OR Start time: 12/13/2011 9:06 AM End time: 12/13/2011 9:08 AM Staffing Anesthesiologist: Sandrea Hughs Performed by: anesthesiologist  Preanesthetic Checklist Completed: patient identified, site marked, surgical consent, pre-op evaluation, timeout performed, IV checked, risks and benefits discussed and monitors and equipment checked Spinal Block Patient position: sitting Prep: DuraPrep Patient monitoring: heart rate, cardiac monitor, continuous pulse ox and blood pressure Approach: midline Location: L3-4 Injection technique: single-shot Needle Needle type: Sprotte  Needle gauge: 24 G Needle length: 9 cm Needle insertion depth: 5 cm Assessment Sensory level: T4

## 2011-12-13 NOTE — Anesthesia Preprocedure Evaluation (Signed)
Anesthesia Evaluation  Patient identified by MRN, date of birth, ID band Patient awake    Reviewed: Allergy & Precautions, H&P , NPO status , Patient's Chart, lab work & pertinent test results  Airway Mallampati: I TM Distance: >3 FB Neck ROM: full    Dental No notable dental hx.    Pulmonary neg pulmonary ROS,    Pulmonary exam normal       Cardiovascular neg cardio ROS     Neuro/Psych Negative Neurological ROS  Negative Psych ROS   GI/Hepatic negative GI ROS, Neg liver ROS,   Endo/Other  Negative Endocrine ROS  Renal/GU negative Renal ROS  Genitourinary negative   Musculoskeletal   Abdominal Normal abdominal exam  (+)   Peds negative pediatric ROS (+)  Hematology negative hematology ROS (+)   Anesthesia Other Findings   Reproductive/Obstetrics (+) Pregnancy                           Anesthesia Physical Anesthesia Plan  ASA: II  Anesthesia Plan: Epidural   Post-op Pain Management:    Induction:   Airway Management Planned:   Additional Equipment:   Intra-op Plan:   Post-operative Plan:   Informed Consent: I have reviewed the patients History and Physical, chart, labs and discussed the procedure including the risks, benefits and alternatives for the proposed anesthesia with the patient or authorized representative who has indicated his/her understanding and acceptance.     Plan Discussed with: CRNA  Anesthesia Plan Comments:         Anesthesia Quick Evaluation

## 2011-12-13 NOTE — Consult Note (Signed)
Neonatology Note:   Attendance at C-section:    I was asked to attend this repeat C/S at 34 0/7 weeks due to onset of preterm labor. The mother is a G2P1 O pos, GBS unknown with preterm labor which began at 31 weeks, treated with 17-Progesterone and Procardia. She received a course of betamethasone at 31 weeks. ROM at delivery, fluid clear. Infant vigorous with good spontaneous cry and tone. Needed only minimal bulb suctioning. Ap 9/9. Lungs clear to ausc in DR. Held briefly by parents, then transported to the NICU in room air with the father in attendance.   Deatra James, MD

## 2011-12-13 NOTE — Anesthesia Postprocedure Evaluation (Signed)
  Anesthesia Post Note  Patient: Veronica Hayes  Procedure(s) Performed:  CESAREAN SECTION - repeat cesarean section  Anesthesia type: Spinal  Patient location: Mother/Baby  Post pain: Pain level controlled  Post assessment: Post-op Vital signs reviewed  Last Vitals:  Filed Vitals:   12/13/11 1611  BP: 99/60  Pulse: 69  Temp: 36.9 C  Resp: 20    Post vital signs: Reviewed  Level of consciousness: awake  Complications: No apparent anesthesia complications

## 2011-12-13 NOTE — Transfer of Care (Signed)
Immediate Anesthesia Transfer of Care Note  Patient: Veronica Hayes  Procedure(s) Performed:  CESAREAN SECTION - repeat cesarean section  Patient Location: PACU  Anesthesia Type: Spinal  Level of Consciousness: awake, alert  and oriented  Airway & Oxygen Therapy: Patient Spontanous Breathing  Post-op Assessment: Report given to PACU RN and Post -op Vital signs reviewed and stable  Post vital signs: Reviewed and stable  Complications: No apparent anesthesia complications

## 2011-12-13 NOTE — Anesthesia Postprocedure Evaluation (Signed)
Anesthesia Post Note  Patient: Veronica Hayes  Procedure(s) Performed:  CESAREAN SECTION - repeat cesarean section  Anesthesia type: Spinal  Patient location: PACU  Post pain: Pain level controlled  Post assessment: Post-op Vital signs reviewed  Last Vitals:  Filed Vitals:   12/13/11 0955  BP: 104/57  Pulse: 87  Temp: 36.3 C  Resp: 16    Post vital signs: Reviewed  Level of consciousness: awake  Complications: No apparent anesthesia complications

## 2011-12-13 NOTE — H&P (Addendum)
Veronica Hayes is a 25 y.o. G2P1 at [redacted]w[redacted]d presenting for painful contractions. Pt notes regular q 4-5 min contractions since early last night, also seen yesterday in MAU with painful ctx and d/c home without cervical change. Hx PTB at 34 weeks with first pregnancy, C/S for breech, "J" extension on uterus, labor contraindicated. She received 17P inj started at 18 wks, BMZ course at 31 wks and on Procardia PRN for ctx/. Good fetal movement, No vaginal bleeding, no leaking fluid. Patient was 1-2/80/-1 at 0500 this am, rcvd IV LR bolus and Terbutaline 0.25 mg x 1, notes no change in ctx intensity, a bit more spaced out.  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 Preterm contractions 11/19/2011  . No pertinent past medical history   . Preterm labor     12/2009 34wks   Past Surgical History  Procedure Date  . Cesarean section     2011   Family History: family history is not on file. Social History:  reports that she has never smoked. She has never used smokeless tobacco. She reports that she does not drink alcohol or use illicit drugs.  Review of Systems - Negative except as noted   Dilation: 4 Effacement (%): 70 Station: -1 Exam by:: Danielle CNM Blood pressure 110/52, pulse 100, resp. rate 18, height 5\' 3"  (1.6 m), weight 68.947 kg (152 lb), SpO2 98.00%.  Physical Exam: Blood pressure 110/52, pulse 100, resp. rate 18, height 5\' 3"  (1.6 m), weight 68.947 kg (152 lb), SpO2 98.00%. General: NAD Heart: RRR, no murmurs Lungs: CTA b/l  Abd: Soft, NT, EFW 5LBS  Ext: no edema Neuro: DTRs normal    Membranes:intact    Presentation  vertex FHR:  Baseline rate 140   Variability moderate  Accelerations present  Decelerations occasional small variables Contractions: Frequency 2-5  Duration 50-60  Intensity moderate  Pertinent Labs/Studies:  CBC, RPR pending fFN positive 12/25  Prenatal labs: ABO, Rh:  O pos Antibody:  neg Rubella:  immune RPR:   NR HBsAg:   NR HIV:   neg GBS:   unknown 1 hr Glucola 112  Genetic screening declined Ultrasound: [id number] Result nl female anatomy  Assessment: 25 y.o. G2P1 at [redacted]w[redacted]d PTL with active cervical change  1 Admit 2 prep for urgent rpt cesarean section   Consultant: Dr. Billy Coast notified    [facility] 12/13/2011, 8:33 AM

## 2011-12-13 NOTE — Addendum Note (Signed)
Addendum  created 12/13/11 1622 by Cephus Shelling   Modules edited:Notes Section

## 2011-12-13 NOTE — Progress Notes (Signed)
Contractions x 6 hours , on procardia for preterm labor

## 2011-12-13 NOTE — Op Note (Signed)
Cesarean Section Procedure Note  Indications: previous uterine incision classical Active labor with cervical change Pre-operative Diagnosis: 34 week 0 day pregnancy.  Post-operative Diagnosis: same  Surgeon: Lenoard Aden   Assistants: Renae Fickle  Anesthesia: Spinal anesthesia  ASA Class: 2  Procedure Details  The patient was seen in the Holding Room. The risks, benefits, complications, treatment options, and expected outcomes were discussed with the patient.  The patient concurred with the proposed plan, giving informed consent. The risks of anesthesia, infection, bleeding and possible injury to other organs discussed. Injury to bowel, bladder, or ureter with possible need for repair discussed. Possible need for transfusion with secondary risks of hepatitis or HIV acquisition discussed. Post operative complications to include but not limited to DVT, PE and Pneumonia noted. The site of surgery properly noted/marked. The patient was taken to Operating Room # 1, identified as Veronica Hayes and the procedure verified as C-Section Delivery. A Time Out was held and the above information confirmed.  After induction of anesthesia, the patient was draped and prepped in the usual sterile manner. A Pfannenstiel incision was made and carried down through the subcutaneous tissue to the fascia. Fascial incision was made and extended transversely using Mayo scissors. The fascia was separated from the underlying rectus tissue superiorly and inferiorly. The peritoneum was identified and entered. Peritoneal incision was extended longitudinally. The utero-vesical peritoneal reflection was incised transversely and the bladder flap was bluntly freed from the lower uterine segment. A low transverse uterine incision(Kerr hysterotomy) was made. Delivered from OT presentation was a  female with Apgar scores of 8 at one minute and 9 at five minutes. After the umbilical cord was clamped and cut cord blood was obtained for  evaluation. The placenta was removed intact and appeared normal. The uterine outline, tubes and ovaries appeared normal. The uterine incision was closed with running locked sutures of 0 Monocryl x 2 layers. Cervical extension closed with O Monocryl.  Hemostasis was observed. Lavage was carried out until clear. The fascia was then reapproximated with running sutures of 0 Monocryl. The skin was reapproximated with staples.  Instrument, sponge, and needle counts were correct prior the abdominal closure and at the conclusion of the case.   Findings: above  Estimated Blood Loss:  600         Drains: Foley                 Specimens: Placenta                 Complications:  None; patient tolerated the procedure well.         Disposition: PACU - hemodynamically stable.         Condition: stable  Attending Attestation: I performed the procedure.

## 2011-12-13 NOTE — Progress Notes (Signed)
Notified of VE and contraction pattern.  Orders received for LR 1 liter bolus.  brething 0.25mg  SQ X1.

## 2011-12-14 LAB — CBC
Hemoglobin: 9 g/dL — ABNORMAL LOW (ref 12.0–15.0)
MCHC: 32.7 g/dL (ref 30.0–36.0)
Platelets: 158 10*3/uL (ref 150–400)
RDW: 13.4 % (ref 11.5–15.5)

## 2011-12-14 MED ORDER — POLYSACCHARIDE IRON 150 MG PO CAPS
150.0000 mg | ORAL_CAPSULE | Freq: Every day | ORAL | Status: DC
  Administered 2011-12-15 – 2011-12-16 (×2): 150 mg via ORAL
  Filled 2011-12-14 (×4): qty 1

## 2011-12-14 NOTE — Progress Notes (Signed)
UR Chart review completed.  

## 2011-12-14 NOTE — Progress Notes (Signed)
Patient ID: Veronica Hayes, female   DOB: Jun 17, 1986, 25 y.o.   MRN: [id number]  POD # 1  Subjective: Pt reports feeling well/ Pain controlled with prescription NSAID's including motrin and narcotic analgesics including percocet Tolerating po/ Foley d/c'ed/Voiding without problems/ No n/v/Flatus pos Activity: out of bed and ambulate Bleeding is light Newborn info:  Information for the patient's newborn:  [name], [name] [[id number]]  female Feeding: breast   Objective: VS: Blood pressure 107/67, pulse 62, temperature 98.1 F (36.7 C), temperature source Oral, resp. rate 18 Intake/Output Summary (Last 24 hours) at 12/14/11 1028 Last data filed at 12/13/11 1820  Gross per 24 hour  Intake   1340 ml  Output    200 ml  Net   1140 ml      Basename 12/14/11 0550 12/13/11 0840  WBC 10.0 14.4*  HGB 9.0* 11.3*  HCT 27.5* 33.6*  PLT 158 183    Blood type: O/Positive/-- (06/14 0000) Rubella: Immune (06/14 0000)    Physical Exam:  General: alert, cooperative and no distress CV: Regular rate and rhythm Resp: clear Abdomen: soft, nontender, normal bowel sounds Incision: healing well, no erythema, well approximated Uterine Fundus: firm, below umbilicus, nontender Lochia: minimal Ext: Homans sign is negative, no sign of DVT and no edema, redness or tenderness in the calves or thighs    A/P: POD # 1/ G2P0202 PTD @ 34wks S/P C/Section d/t hx previous C/S with J extension to uterus/labor contraindicated ABL Anemia; will add iron supplement Doing well Continue routine post op orders   Sign: Arlana Lindau, [facility] 12/14/11 1030

## 2011-12-15 NOTE — Progress Notes (Signed)
Patient denies any further epigastric discomfort.

## 2011-12-15 NOTE — Progress Notes (Signed)
Subjective: POD# 2 Information for the patient's newborn:  Victor, Granados [[id number]]  Female - NICU admit - premature 34 wks  Patient seen in NICU at [facility] Reports feeling well, ambulating to NICU Feeding: breast / pumping Patient reports tolerating PO.  Breast symptoms: colostrum (+) Pain controlled with motrin and percocet Denies HA/SOB/C/P/N/V/dizziness. Flatus present, no BM. She reports vaginal bleeding as normal, without clots.  She is ambulating, urinating without difficult.     Objective:   VS:  Filed Vitals:   12/14/11 1849 12/14/11 2114 12/15/11 0500 12/15/11 1200  BP: 106/64 115/73 106/73 92/60  Pulse: 78 89 77 90  Temp: 98.2 F (36.8 C) 98.8 F (37.1 C) 98.4 F (36.9 C) 98.8 F (37.1 C)  TempSrc: Oral Oral Oral Oral  Resp: 20 19 20 20   Height:      Weight:      SpO2: 99% 99% 99% 97%    No intake or output data in the 24 hours ending 12/15/11 1302      Basename 12/14/11 0550 12/13/11 0840  WBC 10.0 14.4*  HGB 9.0* 11.3*  HCT 27.5* 33.6*  PLT 158 183     Blood type: O/Positive/-- (06/14 0000)  Rubella: Immune (06/14 0000)     Physical Exam:  General: alert, cooperative and no distress CV: Regular rate and rhythm Resp: clear Abdomen: soft, nontender, normal bowel sounds Incision: clean, intact and serous and minimal drainage present, staples intact Uterine Fundus: firm, below umbilicus, nontender Lochia: minimal Ext: edema (+) 1 pedal and Homans sign is negative, no sign of DVT      Assessment/Plan: 25 y.o.  status post Cesarean section. POD# 2.  s/p Cesarean Delivery.  Indications: repeat, PTL at 34 wks                Principal Problem:  *PP care - s/p C/S 12/26  Doing well, stable.    Asymptomatic anemia - continue oral Fe           Routine post-op care Anticipate discharge home in AM.   PAUL,DANIELA 12/15/2011, 1:02 PM

## 2011-12-15 NOTE — Progress Notes (Signed)
CLINICAL SOCIAL WORK  BRIEF PSYCHOSOCIAL ASSESSMENT  Referred by: NICU     On: 12/15/11   For: NICU support      Patient Interview_X_ Family Interview  Other:   PSYCHOSOCIAL DATA:   Lives Alone  Lives with: baby to be discharged to parents' home   Primary Support (Name/Relationship): Shane and Ichelle Diaz Degree of support available: Great support system  CURRENT CONCERNS:     _X_None noted Substance Abuse     Behavioral Health Issues    Financial Resources     Abuse/Neglect/Domestic Violence   Cultural/Religious Issues     Post-Acute Placement    Adjustment to Illness     Knowledge/Cognitive Deficit      Other:     SOCIAL WORK ASSESSMENT/PLAN:  SW met with MOB in her third floor room to introduce myself, complete assessment and evaluate how family is coping with baby's admission to NICU.  Numerous family members were in and out of her room during the assessment.  SW explained support services offered by NICU SWs and gave contact information.  SW also informed MOB of [facility] and gas vouchers if necessary since they will be coming from [place] to visit with baby.  SW has no concerns at this time.  No Further Intervention Required  _X_Psychosocial Support/Ongoing Assessment of Needs Information/Referral to Community Resources Other  PATIENT'S/FAMILY'S RESPONSE TO PLAN OF CARE: MOB was extremely pleasant and states that she and baby are doing well.  She states that her son was born at 34 weeks 2 years ago and spent 3 weeks in the NICU.  She states that she was distraught over the situation at that time because she was not expecting the premature birth/NICU admission.  She told SW that everyone took a great care of her son and therefore, she is much calmer this time and feels like she knows what to expect.  She states that they have a great support system and everything they need for baby at home.  She plans to continue to stay at home with her chidlren and her husband typically travels  with work, but is able to work from home at this time.  He is involved and supportive.  Baby's pediatric follow up will be with Dr. Halm.  MOB states no questions or needs at this time and seemed appreciative of SW's visit.     

## 2011-12-15 NOTE — Progress Notes (Signed)
Patient c/o being awakened by sharp pain in epigastric area. Better after walking to bathroom, and being up in room. Encouraged to ambulate more.  Assessment completed, no abnormal findings. Vital signs within normal limits.

## 2011-12-16 ENCOUNTER — Encounter (HOSPITAL_COMMUNITY): Payer: Self-pay | Admitting: Obstetrics and Gynecology

## 2011-12-16 MED ORDER — IBUPROFEN 600 MG PO TABS: 600.0000 mg | ORAL_TABLET | Freq: Four times a day (QID) | ORAL | Status: AC

## 2011-12-16 MED ORDER — POLYSACCHARIDE IRON 150 MG PO CAPS: 150.0000 mg | ORAL_CAPSULE | Freq: Every day | ORAL | Status: DC

## 2011-12-16 MED ORDER — OXYCODONE-ACETAMINOPHEN 5-325 MG PO TABS: 2.0000 | ORAL_TABLET | ORAL | Status: AC | PRN

## 2011-12-16 NOTE — Progress Notes (Signed)
Patient ID: Veronica Hayes, female   DOB: December 14, 1986, 25 y.o.   MRN: [id number]  POSTOPERATIVE DAY # 3 S/P cesarean section - 34 weeks with PTL   S:         Reports feeling well - much better than last c-section             Tolerating po intake / no nausea / no vomiting / + flatus / no BM             Bleeding is light             Pain controlled withprescription NSAID's including motrin and percocet             Up ad lib / ambulatory  Newborn breast pump for feedings in NICU  / female newborn stable in NICU   O:  A & O x 3 NAD             VS: Blood pressure 120/76, pulse 88, temperature 98.7 F (37.1 C), temperature source Oral, resp. rate 19, height 5\' 3"  (1.6 m), weight 69.4 kg (153 lb), last menstrual period 04/19/2011, SpO2 97.00%, unknown if currently breastfeeding.  Lungs: Clear and unlabored  Heart: regular rate and rhythm / no mumurs  Abdomen: soft, non-tender, non-distended              Fundus: firm, non-tender, U-2             Dressing OFF              Incision:  approximated with staples / no erythema / no ecchymosis                              scant yellow (serous) drainage  Perineum: no edema  Lochia: light  Extremities: no edema, no calf pain or tenderness, negative Homans  A:        POD # 3 S/P repeat cesarean section at 34 weeks            Preterm labor active onset at 34 weeks  P:        Routine postoperative care              Discharge to home     [facility] 12/16/2011, 10:34 AM

## 2011-12-16 NOTE — Discharge Summary (Signed)
POSTOPERATIVE DISCHARGE SUMMARY:  Patient ID: Veronica Hayes MRN: [id number] DOB/AGE: [birth year] 25 y.o.  Admit date: 12/13/2011 Discharge date:  12/16/2011  Admission Diagnoses: 34 weeks active preterm labor / previous preterm birth at 39 weeks / previous cesarean section with vertical extension - NOT candidate for TOLAC   Discharge Diagnoses:   Premature labor / cesarean section at 34 weeks / acute blood loss anemia - stable  Prenatal history: G2P0102   EDC : 01/24/2012, by Last Menstrual Period  Prenatal care at [facility] Primary provider : Marlinda Mike / Dr Billy Coast Prenatal course complicated by hx PTB at 34 weeks / previous cesarean section with vertical extension in "j" fashion ( NOT candidate for TOLAC - treat as vertical uterine incision) / preterm labor in current pregnancy (treated with 17P / BMZ / tocolytic therapy with procardia)  Prenatal Labs: ABO, Rh: O (06/14 0000) positive Antibody: Negative (06/14 0000) Rubella: Immune (06/14 0000)  RPR: NON REACTIVE (12/26 0840)  HBsAg: Negative (06/14 0000)  HIV: Non-reactive (06/14 0000)  GBS: Unknown (12/26 0000)  1 hr Glucola : nl   Medical / Surgical History :  Past medical history:  Past Medical History  Diagnosis Date  . Preterm contractions 11/19/2011  . No pertinent past medical history   . Preterm labor     12/2009 34wks  . PP care - s/p C/S 12/26 12/13/2011    Past surgical history:  Past Surgical History  Procedure Date  . Cesarean section     2011    Family History: History reviewed. No pertinent family history.  Social History:  reports that she has never smoked. She has never used smokeless tobacco. She reports that she does not drink alcohol or use illicit drugs.   Allergies: Review of patient's allergies indicates no known allergies.    Current Medications at time of admission:  Prior to Admission medications   Medication Sig Start Date End Date Taking? Authorizing Provider    prenatal vitamin w/FE, FA (PRENATAL 1 + 1) 27-1 MG TABS Take 1 tablet by mouth daily.     Yes Historical Provider, MD  ibuprofen (ADVIL,MOTRIN) 600 MG tablet Take 1 tablet (600 mg total) by mouth every 6 (six) hours. 12/16/11 12/26/11  Marlinda Mike  oxyCODONE-acetaminophen (PERCOCET) 5-325 MG per tablet Take 2 tablets by mouth every 4 (four) hours as needed (moderate - severe pain). 12/16/11 12/26/11  Marlinda Mike  polysaccharide iron (NIFEREX) 150 MG CAPS capsule Take 1 capsule (150 mg total) by mouth daily. 12/16/11   Marlinda Mike      Intrapartum Course: labor evaluation on arrival to [facility]U reveals cervical change from 1cm to 4cm with regular contractions not responsive to IVF or terbutaline administration / decision to proceed to immediate caesarean section due to previous uterine scar in active labor  Procedures: Cesarean section delivery of female newborn by Dr Billy Coast  See operative report for further details  Postoperative / postpartum course: uneventful - mild acute blood loss anemia  Physical Exam:   VSS: Blood pressure 120/76, pulse 88, temperature 98.7 F (37.1 C), temperature source Oral, resp. rate 19, height 5\' 3"  (1.6 m), weight 69.4 kg (153 lb), last menstrual period 04/19/2011, SpO2 97.00%, unknown if currently breastfeeding.  LABS:  HGB: preop 11 / postop 9.0              PLT: preop 183 / postop 158    Incision:  approximated with staples / no erythema / no ecchymosis / small yellow serous  drainage Staples: intact for removal at [employer] on POD 5- 7  Discharge Instructions:  Discharged Condition: stable Activity: pelvic rest and postoperative restrictions x 2 weeks Diet: routine Medications: PNV, Ibuprofen, Iron and Percocet Current Discharge Medication List    START taking these medications   Details  ibuprofen (ADVIL,MOTRIN) 600 MG tablet Take 1 tablet (600 mg total) by mouth every 6 (six) hours. Qty: 30 tablet, Refills: 0    oxyCODONE-acetaminophen (PERCOCET)  5-325 MG per tablet Take 2 tablets by mouth every 4 (four) hours as needed (moderate - severe pain). Qty: 30 tablet, Refills: 0    polysaccharide iron (NIFEREX) 150 MG CAPS capsule Take 1 capsule (150 mg total) by mouth daily. Qty: 30 each, Refills: 0      CONTINUE these medications which have NOT CHANGED   Details  prenatal vitamin w/FE, FA (PRENATAL 1 + 1) 27-1 MG TABS Take 1 tablet by mouth daily.        STOP taking these medications     hydroxyprogesterone caproate (DELALUTIN) 250 mg/mL OIL      NIFEdipine (PROCARDIA-XL/ADALAT CC) 30 MG 24 hr tablet        Condition: stable Postpartum Instructions: refer to practice specific booklet Discharge to: home Disposition: Home or Self Care Follow up :  Follow-up Information    Follow up with BAILEY,TANYA. Make an appointment in 4 days.   Contact information:   [address] [place] [address] [phone]           Signed: Marlinda Mike 12/16/2011, 10:38 AM

## 2011-12-16 NOTE — Progress Notes (Signed)
Pt d/c home with family, ambulatory to private car. Pt d/c instructions and prescriptions reviewed pt verbalized understanding.

## 2012-01-16 ENCOUNTER — Encounter (HOSPITAL_COMMUNITY): Admission: RE | Payer: Self-pay | Source: Ambulatory Visit

## 2012-01-16 ENCOUNTER — Inpatient Hospital Stay (HOSPITAL_COMMUNITY)
Admission: RE | Admit: 2012-01-16 | Payer: BC Managed Care – PPO | Source: Ambulatory Visit | Admitting: Obstetrics and Gynecology

## 2012-01-16 SURGERY — Surgical Case
Anesthesia: Spinal

## 2013-07-31 ENCOUNTER — Ambulatory Visit (INDEPENDENT_AMBULATORY_CARE_PROVIDER_SITE_OTHER): Payer: BC Managed Care – PPO | Admitting: Family Medicine

## 2013-07-31 VITALS — BP 103/70 | HR 66 | Temp 97.9°F | Wt 124.4 lb

## 2013-07-31 DIAGNOSIS — N390 Urinary tract infection, site not specified: Secondary | ICD-10-CM

## 2013-07-31 LAB — POCT UA - MICROSCOPIC ONLY
WBC, Ur, HPF, POC: NEGATIVE
Yeast, UA: NEGATIVE

## 2013-07-31 LAB — POCT URINALYSIS DIPSTICK
Bilirubin, UA: NEGATIVE
Blood, UA: NEGATIVE
Glucose, UA: NEGATIVE
Ketones, UA: NEGATIVE
Leukocytes, UA: NEGATIVE
Nitrite, UA: NEGATIVE
Protein, UA: NEGATIVE
Spec Grav, UA: >=1.03
Urobilinogen, UA: NEGATIVE
pH, UA: 6

## 2013-07-31 MED ORDER — NITROFURANTOIN MONOHYD MACRO 100 MG PO CAPS: 100.0000 mg | ORAL_CAPSULE | Freq: Two times a day (BID) | ORAL | Status: DC

## 2013-07-31 NOTE — Progress Notes (Signed)
  Subjective:    Patient ID: Veronica Hayes, female    DOB: 12/12/1986, 27 y.o.   MRN: [id number]  HPI This 27 y.o. female presents for evaluation of dysuria and urinary frequency. She takes macrobid for prophylaxis of uti's.   Review of Systems C/o dysuria No chest pain, SOB, HA, dizziness, vision change, N/V, diarrhea, constipation,  myalgias, arthralgias or rash.     Objective:   Physical Exam  Vital signs noted  Well developed well nourished female.  HEENT - Head atraumatic Normocephalic                Eyes - PERRLA, Conjuctiva - clear Sclera- Clear EOMI                Ears - EAC's Wnl TM's Wnl Gross Hearing WNL                Nose - Nares patent                 Throat - oropharanx wnl Respiratory - Lungs CTA bilateral Cardiac - RRR S1 and S2 without murmur GI - Abdomen soft Nontender and bowel sounds active x 4 Extremities - No edema. Neuro - Grossly intact.      Results for orders placed in visit on 07/31/13  POCT UA - MICROSCOPIC ONLY      Result Value Range   WBC, Ur, HPF, POC neg     RBC, urine, microscopic 5-10     Bacteria, U Microscopic many     Mucus, UA mod     Epithelial cells, urine per micros few     Crystals, Ur, HPF, POC occ     Casts, Ur, LPF, POC few     Yeast, UA neg    POCT URINALYSIS DIPSTICK      Result Value Range   Color, UA yellow     Clarity, UA clear     Glucose, UA neg     Bilirubin, UA neg     Ketones, UA neg     Spec Grav, UA >=1.030     Blood, UA neg     pH, UA 6.0     Protein, UA neg     Urobilinogen, UA negative     Nitrite, UA neg     Leukocytes, UA Negative     Assessment & Plan:  UTI (urinary tract infection) - Plan: POCT UA - Microscopic Only, POCT urinalysis dipstick, nitrofurantoin, macrocrystal-monohydrate, (MACROBID) 100 MG capsule Push po fluids, rest and follow up prn.

## 2013-10-29 IMAGING — US US OB DETAIL+14 WK
1 series · 14 of 28 positions shown · non-contrast
Comparison: none

[Series 1: us ob detail+14 wk · 14 of 74 slices shown]
[im 3/74]
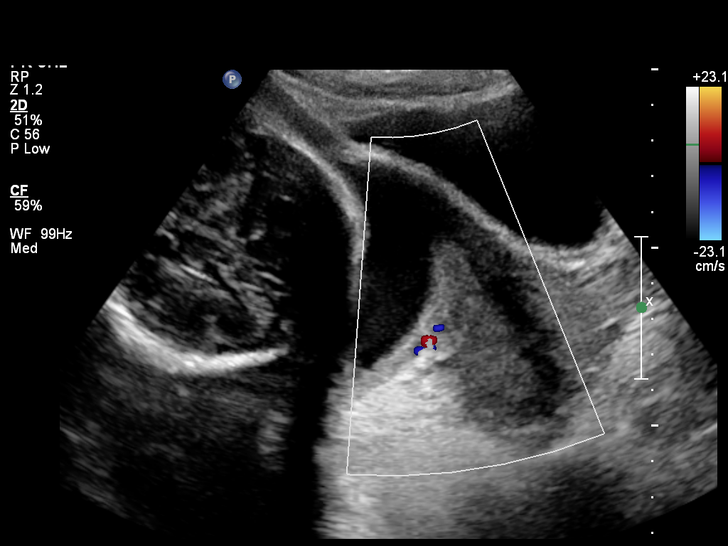
[im 9/74]
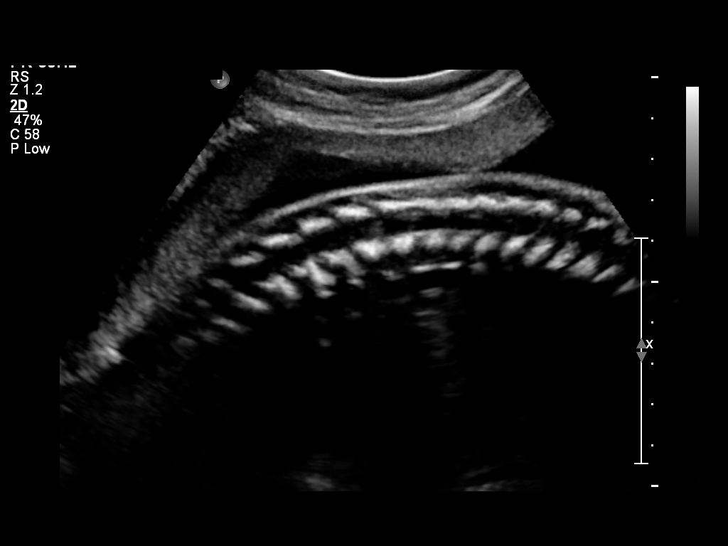
[im 14/74]
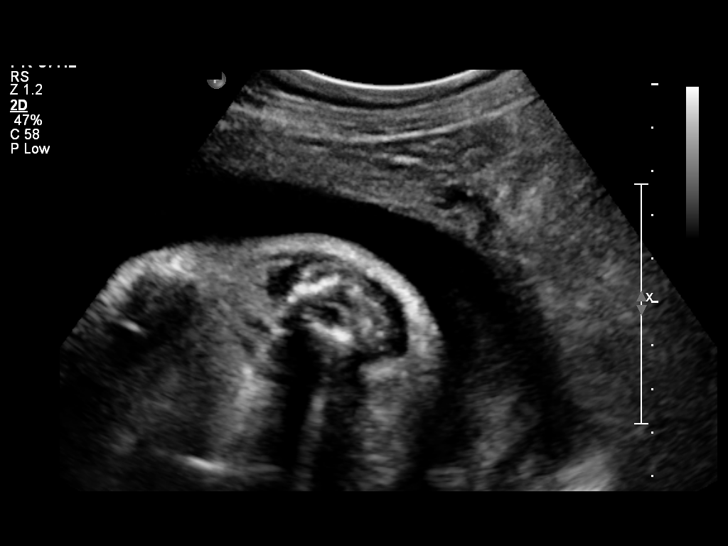
[im 19/74]
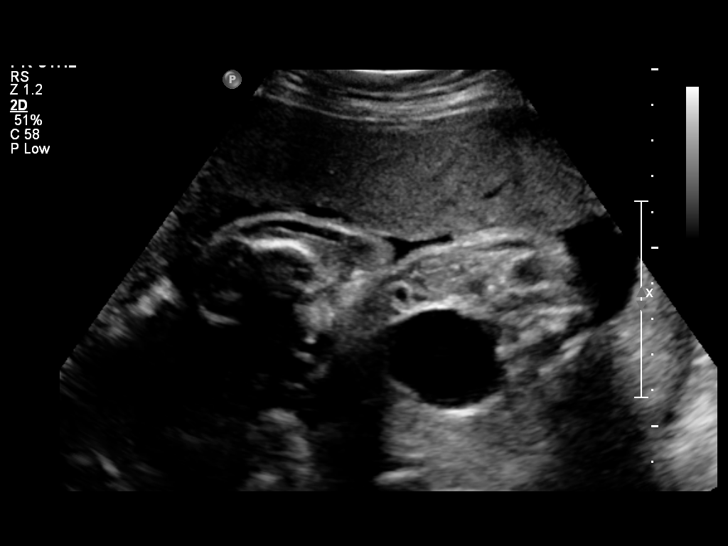
[im 25/74]
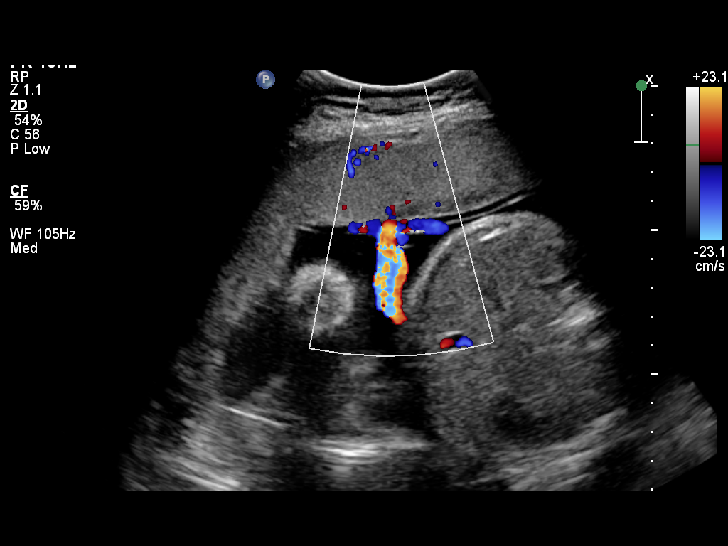
[im 30/74]
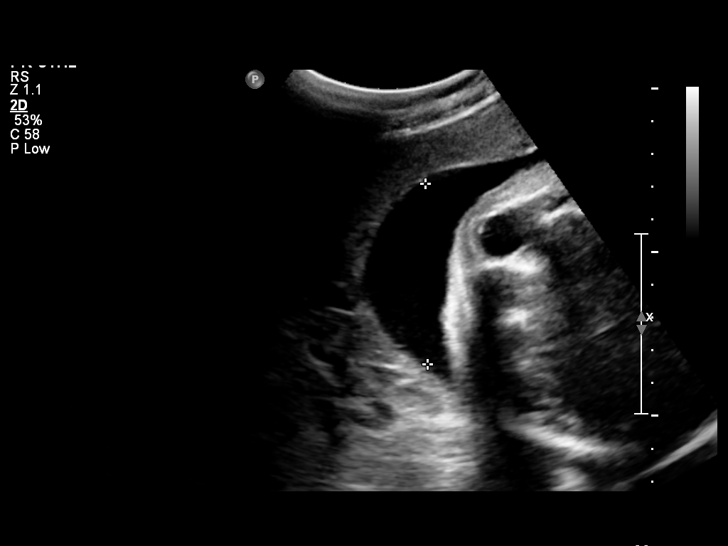
[im 36/74]
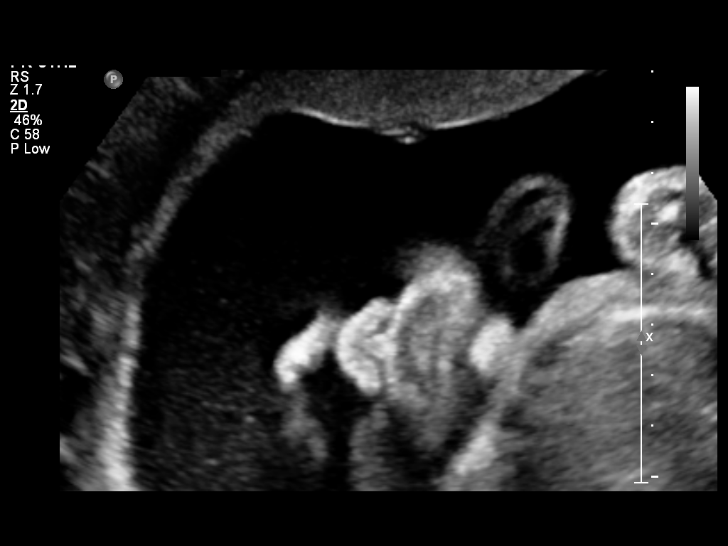
[im 41/74]
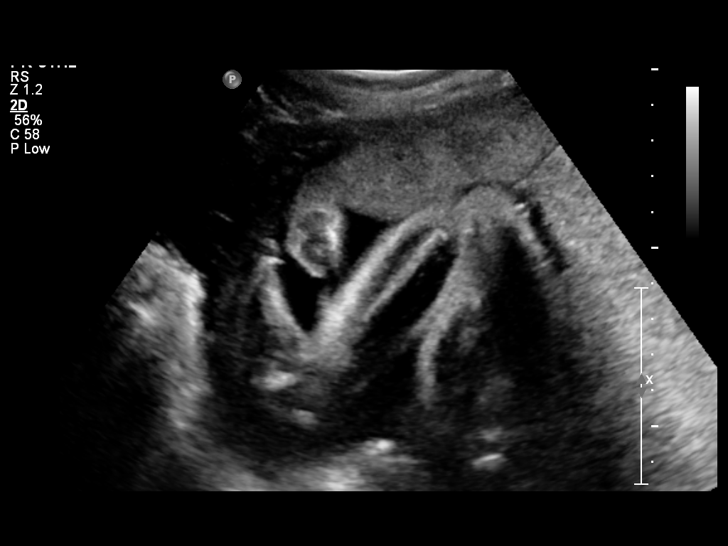
[im 46/74]
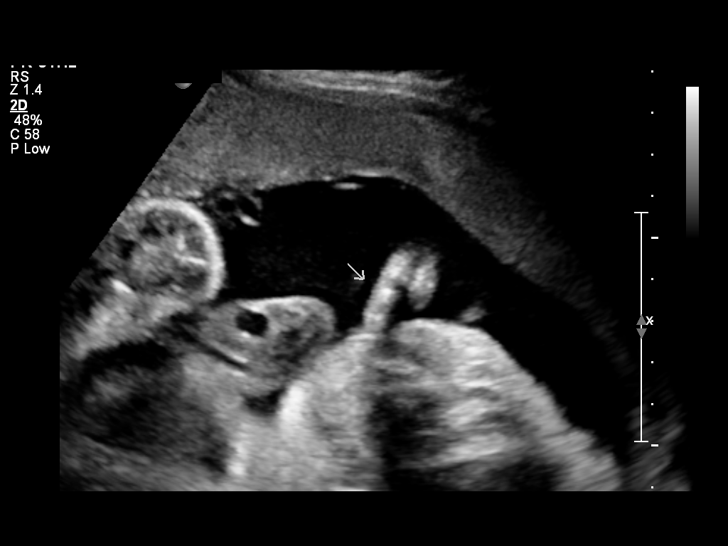
[im 52/74]
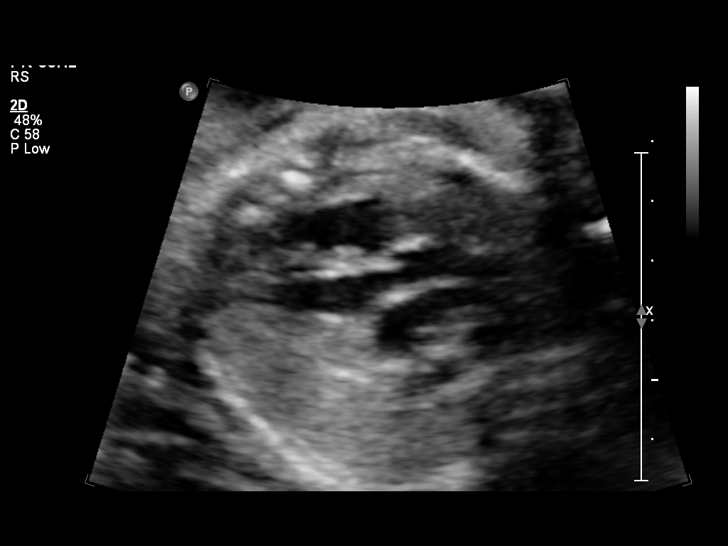
[im 57/74]
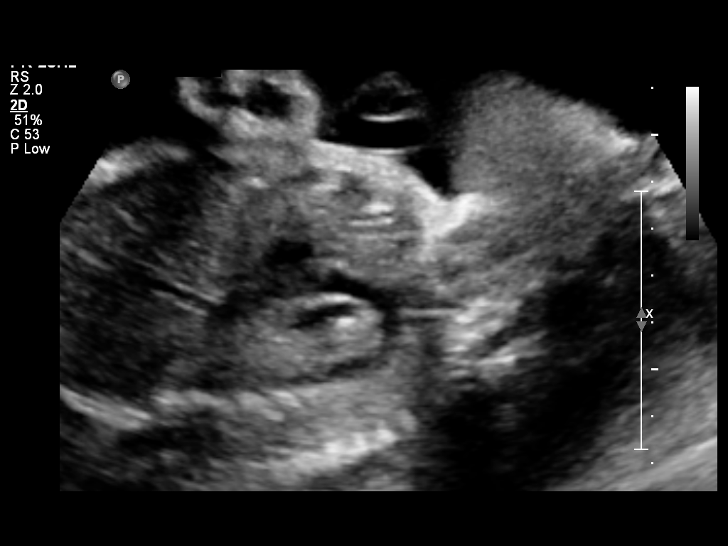
[im 63/74]
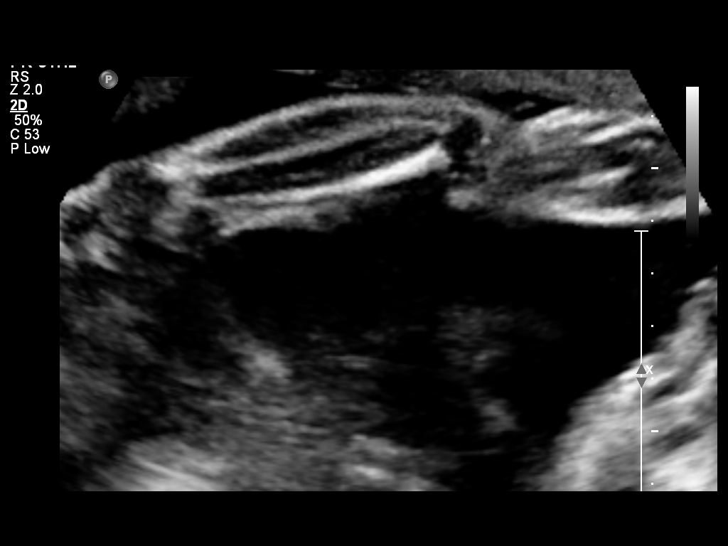
[im 68/74]
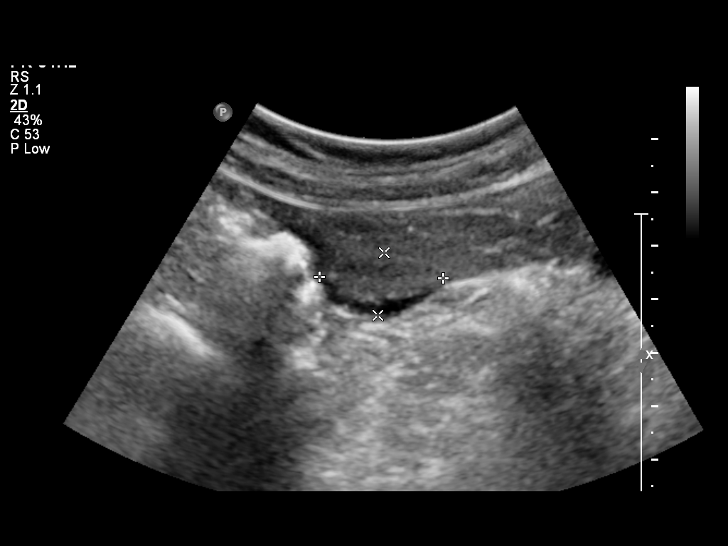
[im 74/74]
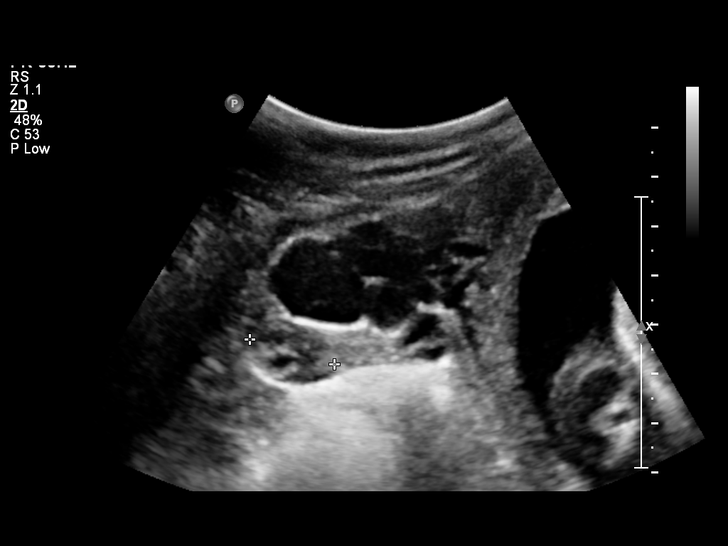

[14 of 28 positions shown; findings below may reference images not displayed]

Canned report from images found in remote index.

Refer to host system for actual result text.

## 2014-10-19 ENCOUNTER — Encounter (HOSPITAL_COMMUNITY): Payer: Self-pay | Admitting: Obstetrics and Gynecology

## 2015-01-29 ENCOUNTER — Encounter: Payer: Self-pay | Admitting: Family Medicine

## 2015-01-29 ENCOUNTER — Ambulatory Visit (INDEPENDENT_AMBULATORY_CARE_PROVIDER_SITE_OTHER): Payer: BLUE CROSS/BLUE SHIELD | Admitting: Family Medicine

## 2015-01-29 VITALS — BP 112/81 | HR 86 | Temp 97.6°F | Ht 63.0 in | Wt 133.2 lb

## 2015-01-29 DIAGNOSIS — N390 Urinary tract infection, site not specified: Secondary | ICD-10-CM

## 2015-01-29 DIAGNOSIS — R5383 Other fatigue: Secondary | ICD-10-CM

## 2015-01-29 DIAGNOSIS — J069 Acute upper respiratory infection, unspecified: Secondary | ICD-10-CM

## 2015-01-29 DIAGNOSIS — R350 Frequency of micturition: Secondary | ICD-10-CM

## 2015-01-29 LAB — POCT URINALYSIS DIPSTICK
Bilirubin, UA: NEGATIVE
Blood, UA: NEGATIVE
Glucose, UA: NEGATIVE
Ketones, UA: NEGATIVE
Leukocytes, UA: NEGATIVE
Nitrite, UA: NEGATIVE
Protein, UA: NEGATIVE
Spec Grav, UA: 1.015
Urobilinogen, UA: NEGATIVE
pH, UA: 5

## 2015-01-29 LAB — POCT UA - MICROSCOPIC ONLY
Bacteria, U Microscopic: NEGATIVE
Casts, Ur, LPF, POC: NEGATIVE
Crystals, Ur, HPF, POC: NEGATIVE
Mucus, UA: NEGATIVE
RBC, urine, microscopic: NEGATIVE
WBC, Ur, HPF, POC: NEGATIVE
Yeast, UA: NEGATIVE

## 2015-01-29 MED ORDER — BENZONATATE 100 MG PO CAPS
100.0000 mg | ORAL_CAPSULE | Freq: Three times a day (TID) | ORAL | Status: DC | PRN
Start: 2015-01-29 — End: 2015-02-15

## 2015-01-29 MED ORDER — CIPROFLOXACIN HCL 500 MG PO TABS: 500.0000 mg | ORAL_TABLET | Freq: Two times a day (BID) | ORAL | Status: DC

## 2015-01-29 MED ORDER — NITROFURANTOIN MONOHYD MACRO 100 MG PO CAPS: 100.0000 mg | ORAL_CAPSULE | Freq: Two times a day (BID) | ORAL | Status: DC

## 2015-01-29 NOTE — Progress Notes (Signed)
   Subjective:    Patient ID: Veronica Hayes, female    DOB: 09/02/1986, 29 y.o.   MRN: [id number]  HPI C/o dysuria, URI sx's, and fatigue.  She has hx of chronic uti's and has been tx'd with macrobid prophylactic.  She uses macrobid 100mg po day of having sex and this has been working.  She needs refill.  She has been having dysuria.  She has been having uri sx's.  She states over last few months she is constantly fatigued and finds herself sleepy.     Review of Systems C/o dysuria, fatigue, and uri sx's.   No chest pain, SOB, HA, dizziness, vision change, N/V, diarrhea, constipation, dysuria, urinary urgency or frequency, myalgias, arthralgias or rash.  Objective:   Physical Exam Vital signs noted  Well developed well nourished female.  HEENT - Head atraumatic Normocephalic                Eyes - PERRLA, Conjuctiva - clear Sclera- Clear EOMI                Ears - EAC's Wnl TM's Wnl Gross Hearing WNL                Nose - Nares patent                 Throat - oropharanx wnl Respiratory - Lungs CTA bilateral Cardiac - RRR S1 and S2 without murmur GI - Abdomen soft Nontender and bowel sounds active x 4 Extremities - No edema. Neuro - Grossly intact.       Assessment & Plan:  Urinary frequency - Plan: POCT UA - Microscopic Only, POCT urinalysis dipstick, ciprofloxacin (CIPRO) 500 MG tablet, Urine culture  Urinary tract infection without hematuria, site unspecified - Plan: ciprofloxacin (CIPRO) 500 MG tablet, Urine culture  URI (upper respiratory infection) - Plan: ciprofloxacin (CIPRO) 500 MG tablet, benzonatate (TESSALON PERLES) 100 MG capsule, Urine culture  Other fatigue - Plan: POCT CBC, Lipid panel, Thyroid Panel With TSH, Vitamin B12, Urine culture, Vit D  25 hydroxy (rtn osteoporosis monitoring), CMP14+EGFR  William J Oxford FNP 

## 2015-02-15 ENCOUNTER — Encounter: Payer: Self-pay | Admitting: Family

## 2015-02-15 ENCOUNTER — Ambulatory Visit (INDEPENDENT_AMBULATORY_CARE_PROVIDER_SITE_OTHER): Payer: BLUE CROSS/BLUE SHIELD | Admitting: Family

## 2015-02-15 ENCOUNTER — Ambulatory Visit (INDEPENDENT_AMBULATORY_CARE_PROVIDER_SITE_OTHER): Payer: BLUE CROSS/BLUE SHIELD

## 2015-02-15 VITALS — BP 118/62 | HR 71 | Temp 98.2°F | Ht 63.0 in | Wt 132.0 lb

## 2015-02-15 DIAGNOSIS — S29011A Strain of muscle and tendon of front wall of thorax, initial encounter: Secondary | ICD-10-CM

## 2015-02-15 DIAGNOSIS — J209 Acute bronchitis, unspecified: Secondary | ICD-10-CM

## 2015-02-15 MED ORDER — METHYLPREDNISOLONE (PAK) 4 MG PO TABS: ORAL_TABLET | ORAL | Status: DC

## 2015-02-15 MED ORDER — AMOXICILLIN-POT CLAVULANATE 875-125 MG PO TABS: 1.0000 | ORAL_TABLET | Freq: Two times a day (BID) | ORAL | Status: DC

## 2015-02-15 MED ORDER — HYDROCODONE-HOMATROPINE 5-1.5 MG/5ML PO SYRP: 5.0000 mL | ORAL_SOLUTION | Freq: Three times a day (TID) | ORAL | Status: DC | PRN

## 2015-02-15 MED ORDER — MELOXICAM 15 MG PO TABS
15.0000 mg | ORAL_TABLET | Freq: Every day | ORAL | Status: DC
Start: 2015-02-15 — End: 2016-05-04

## 2015-02-15 MED ORDER — ALBUTEROL SULFATE HFA 108 (90 BASE) MCG/ACT IN AERS: 2.0000 | INHALATION_SPRAY | Freq: Four times a day (QID) | RESPIRATORY_TRACT | Status: DC | PRN

## 2015-02-15 NOTE — Progress Notes (Addendum)
Subjective:    Patient ID: Veronica Hayes, female    DOB: 1986/01/15, 29 y.o.   MRN: [id number]  Cough This is a recurrent problem. The current episode started 1 to 4 weeks ago. The problem has been waxing and waning. The problem occurs every few minutes. The cough is non-productive. Associated symptoms include nasal congestion, rhinorrhea, shortness of breath and wheezing. Pertinent negatives include no chills, ear congestion, ear pain, fever, headaches, myalgias or sore throat. She has tried rest and prescription cough suppressant (Cipro) for the symptoms. The treatment provided mild relief. There is no history of asthma or COPD.      Review of Systems  Constitutional: Negative.  Negative for fever and chills.  HENT: Positive for rhinorrhea. Negative for ear pain and sore throat.   Eyes: Negative.   Respiratory: Positive for cough, shortness of breath and wheezing.   Cardiovascular: Negative.  Negative for palpitations.  Gastrointestinal: Negative.   Endocrine: Negative.   Genitourinary: Negative.   Musculoskeletal: Negative.  Negative for myalgias.  Neurological: Negative.  Negative for headaches.  Hematological: Negative.   Psychiatric/Behavioral: Negative.   All other systems reviewed and are negative.      Objective:   Physical Exam  Constitutional: She is oriented to person, place, and time. She appears well-developed and well-nourished. No distress.  HENT:  Head: Normocephalic and atraumatic.  Right Ear: External ear normal.  Left Ear: External ear normal.  Mouth/Throat: Oropharynx is clear and moist.  Nasal passage erythemas with mild swelling    Eyes: Pupils are equal, round, and reactive to light.  Neck: Normal range of motion. Neck supple. No thyromegaly present.  Cardiovascular: Normal rate, regular rhythm, normal heart sounds and intact distal pulses.   No murmur heard. Pulmonary/Chest: Effort normal and breath sounds normal. No respiratory distress. She has no  wheezes.  Abdominal: Soft. Bowel sounds are normal. She exhibits no distension. There is no tenderness.  Musculoskeletal: Normal range of motion. She exhibits no edema or tenderness.  Neurological: She is alert and oriented to person, place, and time. She has normal reflexes. No cranial nerve deficit.  Skin: Skin is warm and dry.  Psychiatric: She has a normal mood and affect. Her behavior is normal. Judgment and thought content normal.  Vitals reviewed.  BP 118/62 mmHg  Pulse 71  Temp(Src) 98.2 F (36.8 C) (Oral)  Ht  (1.6 m)  Wt 132 lb (59.875 kg)  BMI 23.39 kg/m2  Chest X-ray- WNL Preliminary reading by Jannifer Rodney, FNP [facility]      Assessment & Plan:  1. Acute bronchitis, unspecified organism -- Take meds as prescribed - Use a cool mist humidifier  -Use saline nose sprays frequently -Saline irrigations of the nose can be very helpful if done frequently.  * 4X daily for 1 week*  * Use of a nettie pot can be helpful with this. Follow directions with this* -Force fluids -For any cough or congestion  Use plain Mucinex- regular strength or max strength is fine   * Children- consult with Pharmacist for dosing -For fever or aces or pains- take tylenol or ibuprofen appropriate for age and weight.  * for fevers greater than 101 orally you may alternate ibuprofen and tylenol every  3 hours. -Throat lozenges if help  - albuterol (PROVENTIL HFA;VENTOLIN HFA) 108 (90 BASE) MCG/ACT inhaler; Inhale 2 puffs into the lungs every 6 (six) hours as needed for wheezing or shortness of breath.  Dispense: 1 Inhaler; Refill: 2 - methylPREDNIsolone (MEDROL DOSPACK)  4 MG tablet; follow package directions  Dispense: 21 tablet; Refill: 0 - amoxicillin-clavulanate (AUGMENTIN) 875-125 MG per tablet; Take 1 tablet by mouth 2 (two) times daily.  Dispense: 14 tablet; Refill: 0 - HYDROcodone-homatropine (HYCODAN) 5-1.5 MG/5ML syrup; Take 5 mLs by mouth every 8 (eight) hours as needed for cough.   Dispense: 120 mL; Refill: 0  2. Muscle strain of chest wall, initial encounter Rest No other NSAID;s - meloxicam (MOBIC) 15 MG tablet; Take 1 tablet (15 mg total) by mouth daily.  Dispense: 30 tablet; Refill: 0  Jannifer Rodneyhristy Jorgeluis Gurganus, FNP

## 2015-02-15 NOTE — Patient Instructions (Addendum)
Acute Bronchitis Bronchitis is inflammation of the airways that extend from the windpipe into the lungs (bronchi). The inflammation often causes mucus to develop. This leads to a cough, which is the most common symptom of bronchitis.  In acute bronchitis, the condition usually develops suddenly and goes away over time, usually in a couple weeks. Smoking, allergies, and asthma can make bronchitis worse. Repeated episodes of bronchitis may cause further lung problems.  CAUSES Acute bronchitis is most often caused by the same virus that causes a cold. The virus can spread from person to person (contagious) through coughing, sneezing, and touching contaminated objects. SIGNS AND SYMPTOMS   Cough.   Fever.   Coughing up mucus.   Body aches.   Chest congestion.   Chills.   Shortness of breath.   Sore throat.  DIAGNOSIS  Acute bronchitis is usually diagnosed through a physical exam. Your health care provider will also ask you questions about your medical history. Tests, such as chest X-rays, are sometimes done to rule out other conditions.  TREATMENT  Acute bronchitis usually goes away in a couple weeks. Oftentimes, no medical treatment is necessary. Medicines are sometimes given for relief of fever or cough. Antibiotic medicines are usually not needed but may be prescribed in certain situations. In some cases, an inhaler may be recommended to help reduce shortness of breath and control the cough. A cool mist vaporizer may also be used to help thin bronchial secretions and make it easier to clear the chest.  HOME CARE INSTRUCTIONS  Get plenty of rest.   Drink enough fluids to keep your urine clear or pale yellow (unless you have a medical condition that requires fluid restriction). Increasing fluids may help thin your respiratory secretions (sputum) and reduce chest congestion, and it will prevent dehydration.   Take medicines only as directed by your health care provider.  If  you were prescribed an antibiotic medicine, finish it all even if you start to feel better.  Avoid smoking and secondhand smoke. Exposure to cigarette smoke or irritating chemicals will make bronchitis worse. If you are a smoker, consider using nicotine gum or skin patches to help control withdrawal symptoms. Quitting smoking will help your lungs heal faster.   Reduce the chances of another bout of acute bronchitis by washing your hands frequently, avoiding people with cold symptoms, and trying not to touch your hands to your mouth, nose, or eyes.   Keep all follow-up visits as directed by your health care provider.  SEEK MEDICAL CARE IF: Your symptoms do not improve after 1 week of treatment.  SEEK IMMEDIATE MEDICAL CARE IF:  You develop an increased fever or chills.   You have chest pain.   You have severe shortness of breath.  You have bloody sputum.   You develop dehydration.  You faint or repeatedly feel like you are going to pass out.  You develop repeated vomiting.  You develop a severe headache. MAKE SURE YOU:   Understand these instructions.  Will watch your condition.  Will get help right away if you are not doing well or get worse. Document Released: 01/11/2005 Document Revised: 04/20/2014 Document Reviewed: 05/27/2013 Exit[employer] Patient Information 2015 [employer]. This information is not intended to replace advice given to you by your health care provider. Make sure you discuss any questions you have with your health care provider.  - Take meds as prescribed - Use a cool mist humidifier  -Use saline nose sprays frequently -Saline irrigations of the nose can   be very helpful if done frequently.  * 4X daily for 1 week*  * Use of a nettie pot can be helpful with this. Follow directions with this* -Force fluids -For any cough or congestion  Use plain Mucinex- regular strength or max strength is fine   * Children- consult with Pharmacist for dosing -For  fever or aces or pains- take tylenol or ibuprofen appropriate for age and weight.  * for fevers greater than 101 orally you may alternate ibuprofen and tylenol every  3 hours. -Throat lozenges if help    Niralya Ohanian, FNP  

## 2015-02-18 ENCOUNTER — Ambulatory Visit: Payer: BLUE CROSS/BLUE SHIELD | Admitting: Family Medicine

## 2016-05-04 ENCOUNTER — Encounter: Payer: Self-pay | Admitting: Family

## 2016-05-04 ENCOUNTER — Ambulatory Visit (INDEPENDENT_AMBULATORY_CARE_PROVIDER_SITE_OTHER): Payer: BLUE CROSS/BLUE SHIELD | Admitting: Family

## 2016-05-04 VITALS — BP 103/58 | HR 63 | Temp 98.5°F | Ht 63.0 in | Wt 132.0 lb

## 2016-05-04 DIAGNOSIS — L237 Allergic contact dermatitis due to plants, except food: Secondary | ICD-10-CM | POA: Diagnosis not present

## 2016-05-04 MED ORDER — TRIAMCINOLONE ACETONIDE 0.5 % EX OINT: 1.0000 "application " | TOPICAL_OINTMENT | Freq: Two times a day (BID) | CUTANEOUS | Status: DC

## 2016-05-04 MED ORDER — PREDNISONE 10 MG (21) PO TBPK: 10.0000 mg | ORAL_TABLET | Freq: Every day | ORAL | Status: DC

## 2016-05-04 MED ORDER — NITROFURANTOIN MONOHYD MACRO 100 MG PO CAPS: ORAL_CAPSULE | ORAL | Status: DC

## 2016-05-04 NOTE — Patient Instructions (Signed)

## 2016-05-04 NOTE — Progress Notes (Signed)
   Subjective:    Patient ID: Veronica Hayes, female    DOB: 1986-07-13, 30 y.o.   MRN: [id number]  Rash This is a new problem. The current episode started in the past 7 days. The problem has been gradually worsening since onset. The affected locations include the back and neck. The rash is characterized by itchiness and redness. She was exposed to nothing. Pertinent negatives include no congestion, diarrhea, eye pain, fever, joint pain, shortness of breath or sore throat. Past treatments include nothing. The treatment provided no relief.      Review of Systems  Constitutional: Negative for fever.  HENT: Negative for congestion and sore throat.   Eyes: Negative for pain.  Respiratory: Negative for shortness of breath.   Gastrointestinal: Negative for diarrhea.  Musculoskeletal: Negative for joint pain.  Skin: Positive for rash.  All other systems reviewed and are negative.      Objective:   Physical Exam  Constitutional: She is oriented to person, place, and time. She appears well-developed and well-nourished. No distress.  HENT:  Head: Normocephalic.  Eyes: Pupils are equal, round, and reactive to light.  Neck: Normal range of motion. Neck supple. No thyromegaly present.  Cardiovascular: Normal rate, regular rhythm, normal heart sounds and intact distal pulses.   No murmur heard. Pulmonary/Chest: Effort normal and breath sounds normal. No respiratory distress. She has no wheezes.  Abdominal: Soft. Bowel sounds are normal. She exhibits no distension. There is no tenderness.  Musculoskeletal: Normal range of motion. She exhibits no edema or tenderness.  Neurological: She is alert and oriented to person, place, and time.  Skin: Skin is warm and dry. Rash noted. There is erythema.  Erythemas rash on back, left abdomen, and neck  Psychiatric: She has a normal mood and affect. Her behavior is normal. Judgment and thought content normal.  Vitals reviewed.   BP 103/58 mmHg  Pulse 63   Temp(Src) 98.5 F (36.9 C) (Oral)  Ht 5\' 3"  (1.6 m)  Wt 132 lb (59.875 kg)  BMI 23.39 kg/m2       Assessment & Plan:  1. Contact dermatitis due to poison ivy -Do not scratch -Wear protective clothing while outside- Long sleeves and long pants -Take a shower as soon as possible after being outside RTO prn - triamcinolone ointment (KENALOG) 0.5 %; Apply 1 application topically 2 (two) times daily.  Dispense: 60 g; Refill: 1 - predniSONE (STERAPRED UNI-PAK 21 TAB) 10 MG (21) TBPK tablet; Take 1 tablet (10 mg total) by mouth daily. As directed x 6 days  Dispense: 21 tablet; Refill: 0  Jannifer Rodneyhristy Nitara Szczerba, FNP

## 2016-10-07 ENCOUNTER — Telehealth: Payer: Self-pay

## 2016-10-07 NOTE — Telephone Encounter (Signed)
Patient last seen [facility] 05/04/16. Patient states that she gets frequent UTI's after sex and would like a refill of macrobid 100mg  to take after intercourse to help prevent the UTI's. Patient aware this will not be addressed until Monday. Please advise and send back to the pools.

## 2016-10-09 MED ORDER — NITROFURANTOIN MONOHYD MACRO 100 MG PO CAPS: ORAL_CAPSULE | ORAL | 0 refills | Status: DC

## 2016-10-09 NOTE — Addendum Note (Signed)
Addended by: Jannifer RodneyHAWKS, Shamirah Ivan A on: 10/09/2016 08:18 AM   Modules accepted: Orders

## 2016-10-09 NOTE — Telephone Encounter (Signed)
Refill of macrobid Prescription sent to pharmacy

## 2016-11-07 ENCOUNTER — Encounter: Payer: Self-pay | Admitting: Pediatrics

## 2016-11-07 ENCOUNTER — Ambulatory Visit (INDEPENDENT_AMBULATORY_CARE_PROVIDER_SITE_OTHER): Payer: BLUE CROSS/BLUE SHIELD | Admitting: Pediatrics

## 2016-11-07 VITALS — BP 111/68 | HR 81 | Temp 98.3°F | Ht 63.0 in | Wt 133.0 lb

## 2016-11-07 DIAGNOSIS — J069 Acute upper respiratory infection, unspecified: Secondary | ICD-10-CM | POA: Diagnosis not present

## 2016-11-07 DIAGNOSIS — H65111 Acute and subacute allergic otitis media (mucoid) (sanguinous) (serous), right ear: Secondary | ICD-10-CM

## 2016-11-07 DIAGNOSIS — N390 Urinary tract infection, site not specified: Secondary | ICD-10-CM | POA: Diagnosis not present

## 2016-11-07 MED ORDER — AMOXICILLIN 500 MG PO CAPS: 500.0000 mg | ORAL_CAPSULE | Freq: Two times a day (BID) | ORAL | 0 refills | Status: DC

## 2016-11-07 MED ORDER — NITROFURANTOIN MONOHYD MACRO 100 MG PO CAPS: ORAL_CAPSULE | ORAL | 11 refills | Status: DC

## 2016-11-07 NOTE — Patient Instructions (Signed)
Netipot with distilled water 2-3 times a day to clear out sinuses Or Normal saline nasal spray Flonase steroid nasal spray  Cetirizine or similar antihistamine allergy pill Ibuprofen 600mg  three times a day Lots of fluids

## 2016-11-07 NOTE — Progress Notes (Signed)
  Subjective:   Patient ID: Veronica Hayes, female    DOB: [birth year], 30 y.o.   MRN: [id number] CC: Nasal Congestion; Cough; and Headache  HPI: Veronica Hayes is a 30 y.o. female presenting for Nasal Congestion; Cough; and Headache  Feeling sore and tender around head, hips Coughing a lot trying to go to sleep No fevers Worsening congestion, dark green Dayquil, nyquil Sinus OTC meds Not helping  Relevant past medical, surgical, family and social history reviewed. Allergies and medications reviewed and updated. History  Smoking Status  . Never Smoker  Smokeless Tobacco  . Never Used   ROS: Per HPI   Objective:    BP 111/68   Pulse 81   Temp 98.3 F (36.8 C) (Oral)   Ht 5\' 3"  (1.6 m)   Wt 133 lb (60.3 kg)   BMI 23.56 kg/m   Wt Readings from Last 3 Encounters:  11/07/16 133 lb (60.3 kg)  05/04/16 132 lb (59.9 kg)  02/15/15 132 lb (59.9 kg)    Gen: NAD, alert, cooperative with exam, NCAT EYES: EOMI, no conjunctival injection, or no icterus ENT:  R TM red, bulging, L TM dull gray, OP without erythema, TTP R maxillary sinus, frontal sinuses CV: NRRR, normal S1/S2, no murmur Resp: CTABL, no wheezes, normal WOB Ext: No edema, warm Neuro: Alert and oriented  Assessment & Plan:  Veronica Hayes was seen today for nasal congestion, cough and headache.  Diagnoses and all orders for this visit:  Acute mucoid otitis media of right ear -     amoxicillin (AMOXIL) 500 MG capsule; Take 1 capsule (500 mg total) by mouth 2 (two) times daily.  Recurrent UTI Had recurrent UTis until started post-coital px, has helped a lot Will refill -     nitrofurantoin, macrocrystal-monohydrate, (MACROBID) 100 MG capsule; One pill before sex  Acute URI Discussed symptomatic care, treatment of AOM as above  Follow up plan: prn Rex Krasarol Addi Pak, MD [facility]

## 2017-01-24 IMAGING — CR DG CHEST 2V
2 series · 2 of 2 positions shown · non-contrast
Comparison: None.

CLINICAL DATA: Acute bronchitis.  Chest pain and cough

EXAM:
CHEST  2 VIEW

[view not recorded (1 of 2)]
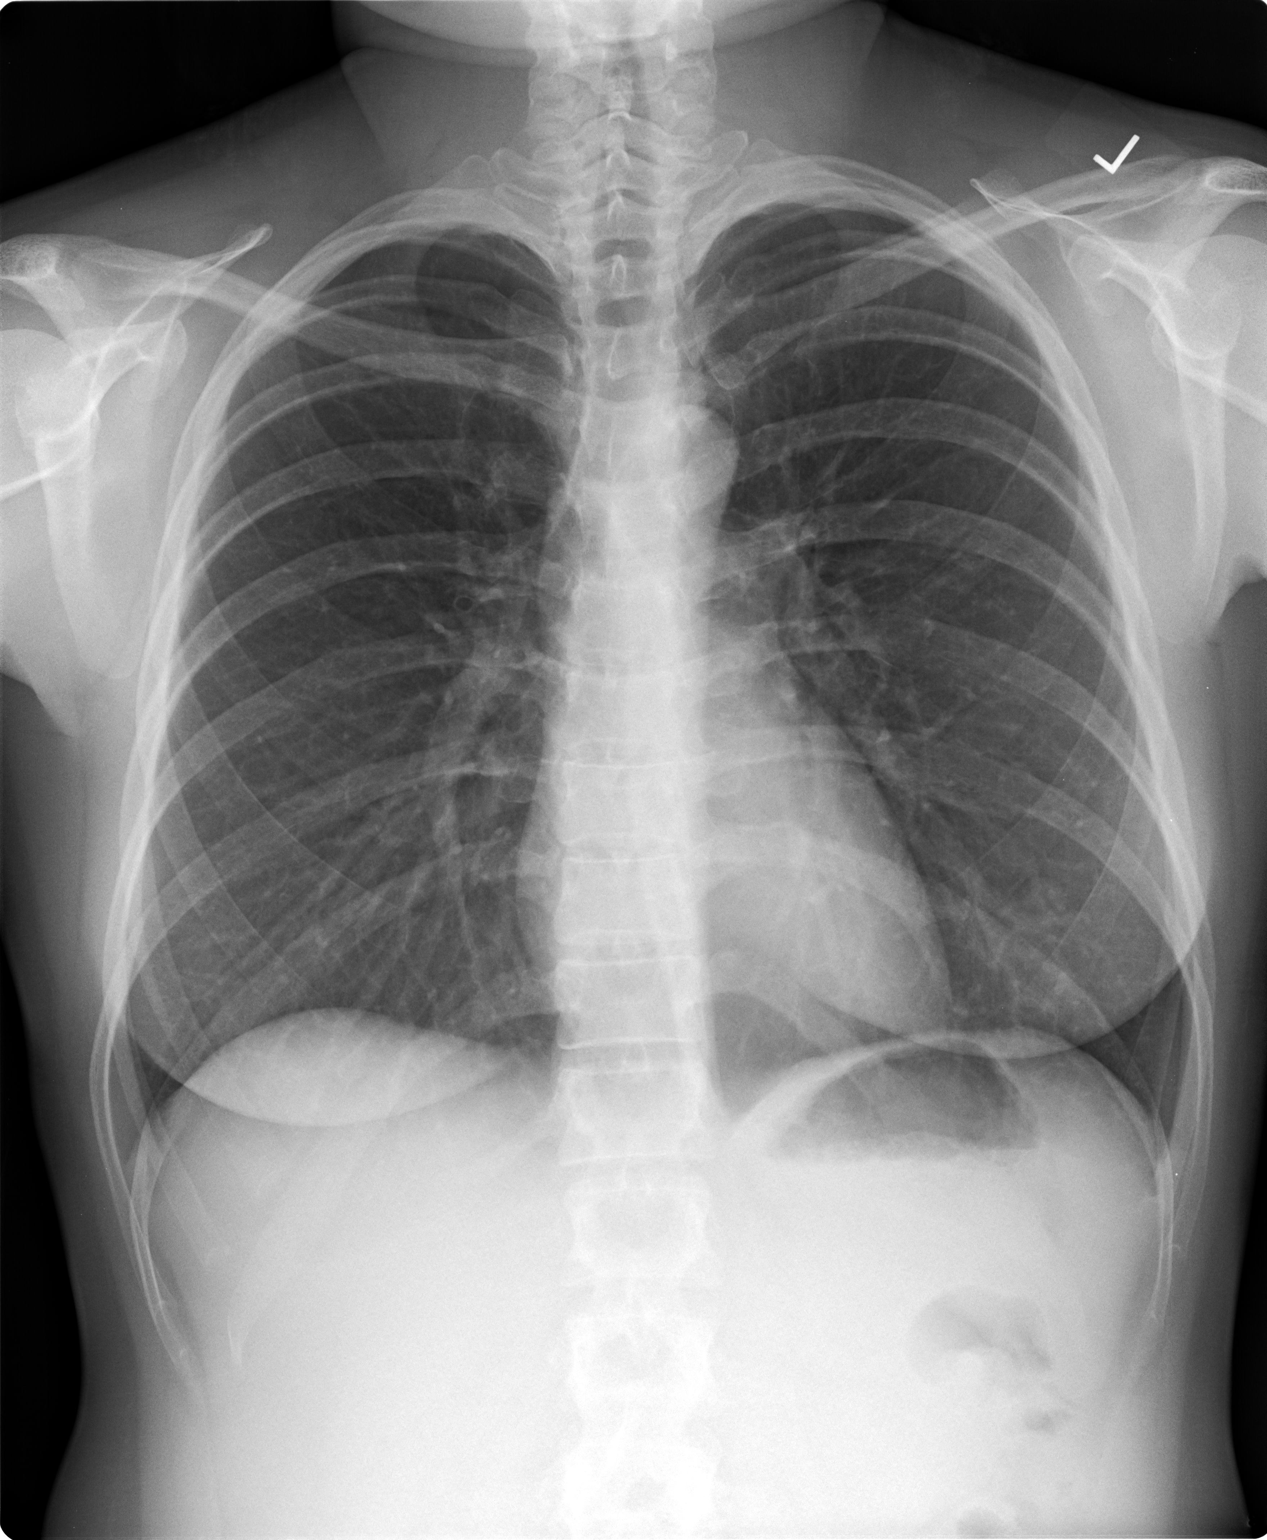

[view not recorded (2 of 2)]
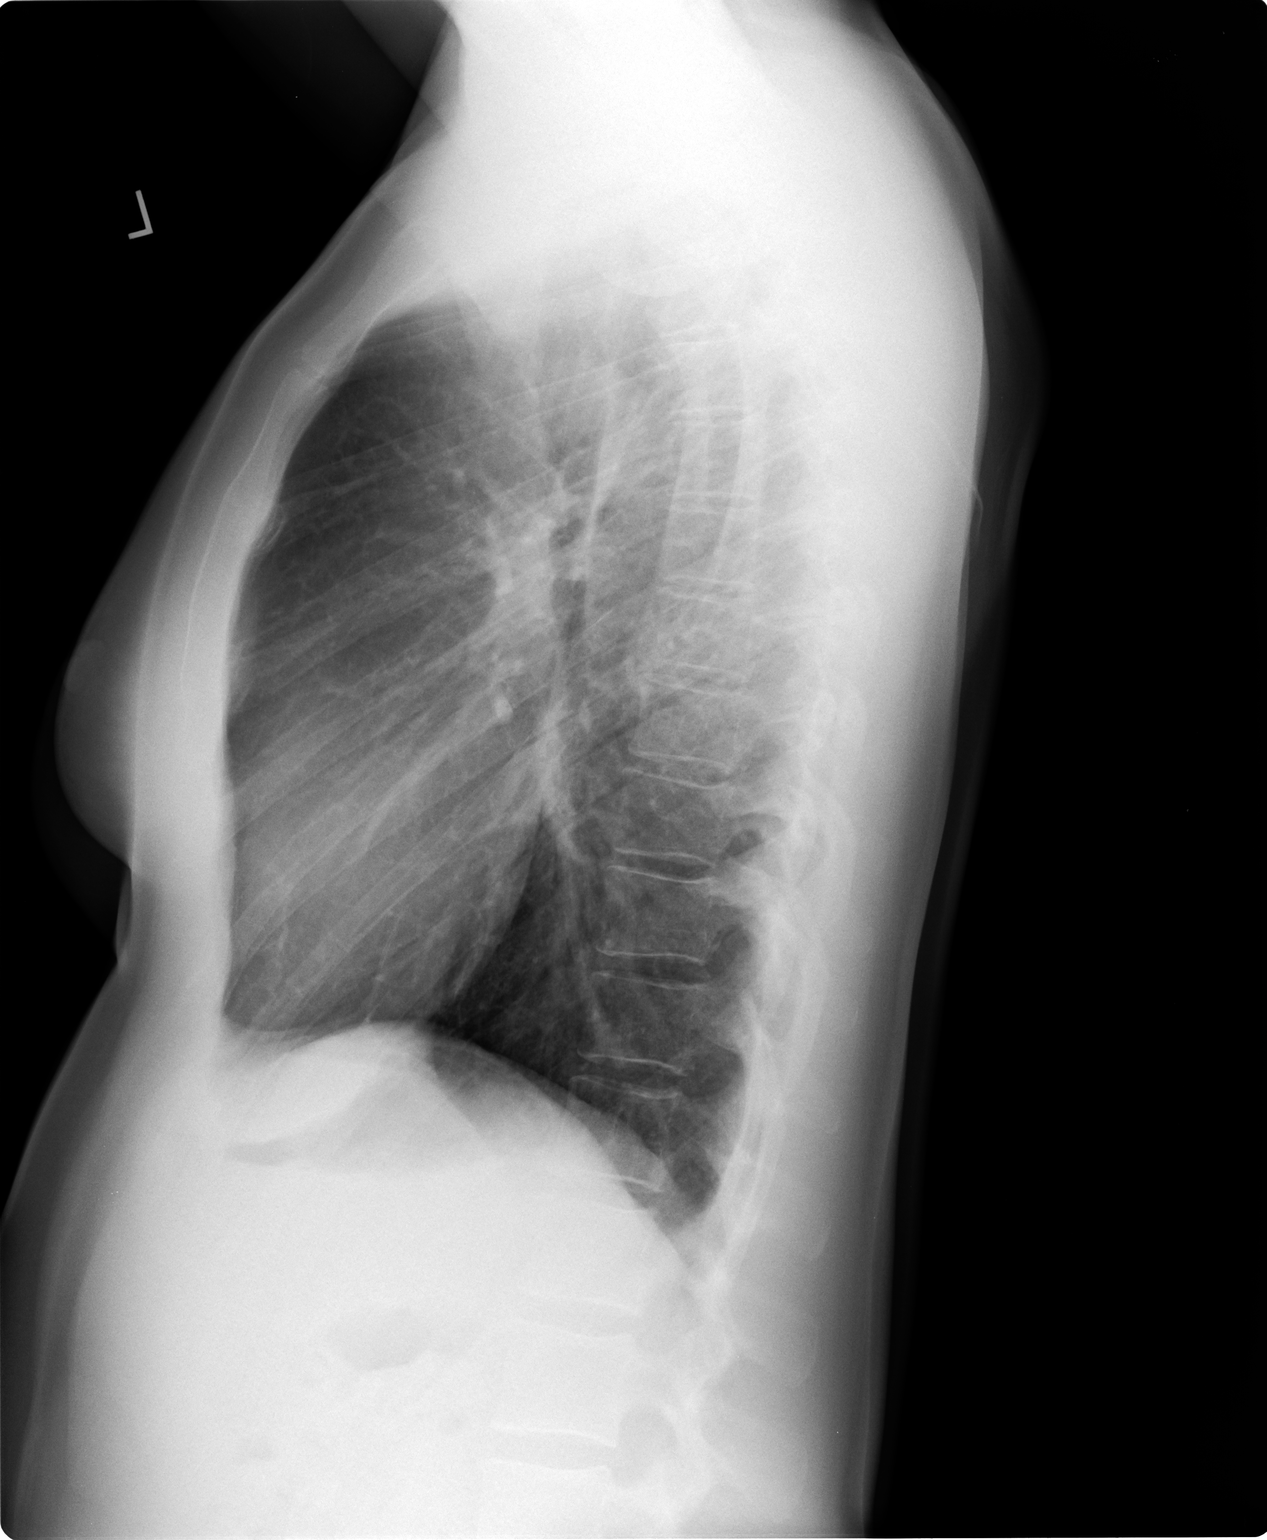

[2 of 2 positions shown; findings below may reference images not displayed]

FINDINGS: The heart size and mediastinal contours are within normal limits.
Both lungs are clear. The visualized skeletal structures are
unremarkable.
IMPRESSION: No active cardiopulmonary disease.

## 2018-03-10 ENCOUNTER — Other Ambulatory Visit: Payer: Self-pay | Admitting: Pediatrics

## 2018-03-10 DIAGNOSIS — N390 Urinary tract infection, site not specified: Secondary | ICD-10-CM

## 2018-03-11 ENCOUNTER — Ambulatory Visit (INDEPENDENT_AMBULATORY_CARE_PROVIDER_SITE_OTHER): Payer: 59 | Admitting: Family

## 2018-03-11 ENCOUNTER — Encounter: Payer: Self-pay | Admitting: Family

## 2018-03-11 VITALS — BP 104/70 | HR 70 | Temp 98.5°F | Ht 63.0 in | Wt 133.4 lb

## 2018-03-11 DIAGNOSIS — J020 Streptococcal pharyngitis: Secondary | ICD-10-CM

## 2018-03-11 DIAGNOSIS — J029 Acute pharyngitis, unspecified: Secondary | ICD-10-CM | POA: Diagnosis not present

## 2018-03-11 DIAGNOSIS — N390 Urinary tract infection, site not specified: Secondary | ICD-10-CM

## 2018-03-11 LAB — RAPID STREP SCREEN (MED CTR MEBANE ONLY): Strep Gp A Ag, IA W/Reflex: POSITIVE — AB

## 2018-03-11 MED ORDER — NITROFURANTOIN MONOHYD MACRO 100 MG PO CAPS: ORAL_CAPSULE | ORAL | 11 refills | Status: DC

## 2018-03-11 MED ORDER — AMOXICILLIN 500 MG PO CAPS: 500.0000 mg | ORAL_CAPSULE | Freq: Two times a day (BID) | ORAL | 0 refills | Status: DC

## 2018-03-11 NOTE — Patient Instructions (Signed)
Strep Throat Strep throat is a bacterial infection of the throat. Your health care provider may call the infection tonsillitis or pharyngitis, depending on whether there is swelling in the tonsils or at the back of the throat. Strep throat is most common during the cold months of the year in children who are 5-33 years of age, but it can happen during any season in people of any age. This infection is spread from person to person (contagious) through coughing, sneezing, or close contact. What are the causes? Strep throat is caused by the bacteria called Streptococcus pyogenes. What increases the risk? This condition is more likely to develop in:  People who spend time in crowded places where the infection can spread easily.  People who have close contact with someone who has strep throat.  What are the signs or symptoms? Symptoms of this condition include:  Fever or chills.  Redness, swelling, or pain in the tonsils or throat.  Pain or difficulty when swallowing.  White or yellow spots on the tonsils or throat.  Swollen, tender glands in the neck or under the jaw.  Red rash all over the body (rare).  How is this diagnosed? This condition is diagnosed by performing a rapid strep test or by taking a swab of your throat (throat culture test). Results from a rapid strep test are usually ready in a few minutes, but throat culture test results are available after one or two days. How is this treated? This condition is treated with antibiotic medicine. Follow these instructions at home: Medicines  Take over-the-counter and prescription medicines only as told by your health care provider.  Take your antibiotic as told by your health care provider. Do not stop taking the antibiotic even if you start to feel better.  Have family members who also have a sore throat or fever tested for strep throat. They may need antibiotics if they have the strep infection. Eating and drinking  Do not  share food, drinking cups, or personal items that could cause the infection to spread to other people.  If swallowing is difficult, try eating soft foods until your sore throat feels better.  Drink enough fluid to keep your urine clear or pale yellow. General instructions  Gargle with a salt-water mixture 3-4 times per day or as needed. To make a salt-water mixture, completely dissolve -1 tsp of salt in 1 cup of warm water.  Make sure that all household members wash their hands well.  Get plenty of rest.  Stay home from school or work until you have been taking antibiotics for 24 hours.  Keep all follow-up visits as told by your health care provider. This is important. Contact a health care provider if:  The glands in your neck continue to get bigger.  You develop a rash, cough, or earache.  You cough up a thick liquid that is green, yellow-brown, or bloody.  You have pain or discomfort that does not get better with medicine.  Your problems seem to be getting worse rather than better.  You have a fever. Get help right away if:  You have new symptoms, such as vomiting, severe headache, stiff or painful neck, chest pain, or shortness of breath.  You have severe throat pain, drooling, or changes in your voice.  You have swelling of the neck, or the skin on the neck becomes red and tender.  You have signs of dehydration, such as fatigue, dry mouth, and decreased urination.  You become increasingly sleepy, or   you cannot wake up completely.  Your joints become red or painful. This information is not intended to replace advice given to you by your health care provider. Make sure you discuss any questions you have with your health care provider. Document Released: 12/01/2000 Document Revised: 08/02/2016 Document Reviewed: 03/29/2015 Elsevier Interactive Patient Education  2018 [employer]vier Inc[employer]  

## 2018-03-11 NOTE — Progress Notes (Signed)
   Subjective:    Patient ID: Veronica Hayes, female    DOB: [birth year], 32 y.o.   MRN: [id number]  Sore Throat   This is a new problem. The current episode started in the past 7 days. The problem has been waxing and waning. There has been no fever. The pain is at a severity of 6/10. The pain is moderate. Associated symptoms include ear pain, a hoarse voice and trouble swallowing. Pertinent negatives include no congestion, coughing, ear discharge or headaches. She has tried acetaminophen for the symptoms. The treatment provided mild relief.      Review of Systems  HENT: Positive for ear pain, hoarse voice and trouble swallowing. Negative for congestion and ear discharge.   Respiratory: Negative for cough.   Neurological: Negative for headaches.  All other systems reviewed and are negative.      Objective:   Physical Exam  Constitutional: She is oriented to person, place, and time. She appears well-developed and well-nourished. No distress.  HENT:  Head: Normocephalic and atraumatic.  Right Ear: External ear normal.  Left Ear: External ear normal.  Nose: Mucosal edema and rhinorrhea present.  Mouth/Throat: Posterior oropharyngeal edema and posterior oropharyngeal erythema present.  Eyes: Pupils are equal, round, and reactive to light.  Neck: Normal range of motion. Neck supple. No thyromegaly present.  Cardiovascular: Normal rate, regular rhythm, normal heart sounds and intact distal pulses.  No murmur heard. Pulmonary/Chest: Effort normal and breath sounds normal. No respiratory distress. She has no wheezes.  Abdominal: Soft. Bowel sounds are normal. She exhibits no distension. There is no tenderness.  Musculoskeletal: Normal range of motion. She exhibits no edema or tenderness.  Neurological: She is alert and oriented to person, place, and time. She has normal reflexes. No cranial nerve deficit.  Skin: Skin is warm and dry.  Psychiatric: She has a normal mood and affect. Her  behavior is normal. Judgment and thought content normal.  Vitals reviewed.     BP 104/70   Pulse 70   Temp 98.5 F (36.9 C) (Oral)   Ht 5\' 3"  (1.6 m)   Wt 133 lb 6.4 oz (60.5 kg)   BMI 23.63 kg/m      Assessment & Plan:  1. Sore throat - [facility] Strep Screen ([facility])  2. Strep throat - Take meds as prescribed - Use a cool mist humidifier  -Use saline nose sprays frequently -Force fluids -For any cough or congestion  Use plain Mucinex- regular strength or max strength is fine -For fever or aces or pains- take tylenol or ibuprofen. -Throat lozenges if help -New toothbrush in 3 days Amoxicillin  - nitrofurantoin, macrocrystal-monohydrate, (MACROBID) 100 MG capsule; One pill before sex  Dispense: 30 capsule; Refill: 11  3. Recurrent UTI - nitrofurantoin, macrocrystal-monohydrate, (MACROBID) 100 MG capsule; One pill before sex  Dispense: 30 capsule; Refill: 11    Veronica Rodneyhristy Lucetta Baehr, FNP

## 2018-04-03 ENCOUNTER — Ambulatory Visit (INDEPENDENT_AMBULATORY_CARE_PROVIDER_SITE_OTHER): Payer: 59 | Admitting: Pediatrics

## 2018-04-03 ENCOUNTER — Encounter: Payer: Self-pay | Admitting: Pediatrics

## 2018-04-03 VITALS — BP 111/63 | HR 68 | Temp 98.0°F | Ht 63.0 in | Wt 132.8 lb

## 2018-04-03 DIAGNOSIS — L259 Unspecified contact dermatitis, unspecified cause: Secondary | ICD-10-CM | POA: Diagnosis not present

## 2018-04-03 MED ORDER — PREDNISONE 20 MG PO TABS: ORAL_TABLET | ORAL | 0 refills | Status: DC

## 2018-04-03 MED ORDER — TRIAMCINOLONE ACETONIDE 0.1 % EX OINT: 1.0000 "application " | TOPICAL_OINTMENT | Freq: Two times a day (BID) | CUTANEOUS | 0 refills | Status: DC

## 2018-04-03 NOTE — Progress Notes (Signed)
  Subjective:   Patient ID: Veronica Hayes, female    DOB: 1986/07/13, 32 y.o.   MRN: [id number] CC: Poison Oak (Arm, leg, arm)  HPI: Veronica Rollsshley Kluger is a 32 y.o. female presenting for Poison Oak (Arm, leg, arm)  Started 4 days ago.  Thinks she was exposed to her husband's close which had poison oak on them.  She has had reactions to poison Oak in the past.  Has patches of skin irritation, itching scattered, left forearm, left side, right shoulder.  Has been putting calamine lotion or steroid on the areas.  Helps with the itching some.  Itching bother her the most.  The place on her left forearm has been weeping some.  No other rash.  No fevers.  Appetite is been fine.  No trouble breathing.  Relevant past medical, surgical, family and social history reviewed. Allergies and medications reviewed and updated. Social History   Tobacco Use  Smoking Status Never Smoker  Smokeless Tobacco Never Used   ROS: Per HPI   Objective:    BP 111/63   Pulse 68   Temp 98 F (36.7 C) (Oral)   Ht 5\' 3"  (1.6 m)   Wt 132 lb 12.8 oz (60.2 kg)   BMI 23.52 kg/m   Wt Readings from Last 3 Encounters:  04/03/18 132 lb 12.8 oz (60.2 kg)  03/11/18 133 lb 6.4 oz (60.5 kg)  11/07/16 133 lb (60.3 kg)    Gen: NAD, alert, cooperative with exam, NCAT EYES: EOMI, no conjunctival injection, or no icterus CV: NRRR, normal S1/S2, no murmur, distal pulses 2+ b/l Resp: CTABL, no wheezes, normal WOB Abd: +BS, soft, NTND Ext: No edema, warm Neuro: Alert and oriented, strength equal b/l UE and LE, coordination grossly normal Skin: Scattered red patches with red papules, left arm, trunk, right leg.  Left arm patch approximately 4 cm x 5 cm, clear drainage. Assessment & Plan:  Morrie Sheldonshley was seen today for poison oak.  Diagnoses and all orders for this visit:  Contact dermatitis, unspecified contact dermatitis type, unspecified trigger -     predniSONE (DELTASONE) 20 MG tablet; 1 po at same time daily for 3 days -      triamcinolone ointment (KENALOG) 0.1 %; Apply 1 application topically 2 (two) times daily.   Follow up plan: As needed, return precautions discussed Rex Krasarol Husam Hohn, MD [facility]

## 2018-10-16 ENCOUNTER — Encounter: Payer: Self-pay | Admitting: Family Medicine

## 2018-10-16 ENCOUNTER — Ambulatory Visit (INDEPENDENT_AMBULATORY_CARE_PROVIDER_SITE_OTHER): Payer: BLUE CROSS/BLUE SHIELD | Admitting: Family Medicine

## 2018-10-16 VITALS — BP 119/81 | HR 80 | Temp 97.7°F | Ht 63.0 in | Wt 132.2 lb

## 2018-10-16 DIAGNOSIS — N3000 Acute cystitis without hematuria: Secondary | ICD-10-CM | POA: Diagnosis not present

## 2018-10-16 LAB — URINALYSIS, COMPLETE
BILIRUBIN UA: NEGATIVE
Glucose, UA: NEGATIVE
Ketones, UA: NEGATIVE
LEUKOCYTES UA: NEGATIVE
Nitrite, UA: NEGATIVE
PH UA: 6 (ref 5.0–7.5)
PROTEIN UA: NEGATIVE
RBC UA: NEGATIVE
Specific Gravity, UA: 1.01 (ref 1.005–1.030)
Urobilinogen, Ur: 0.2 mg/dL (ref 0.2–1.0)

## 2018-10-16 LAB — MICROSCOPIC EXAMINATION: Renal Epithel, UA: NONE SEEN /hpf

## 2018-10-16 MED ORDER — CIPROFLOXACIN HCL 500 MG PO TABS: 500.0000 mg | ORAL_TABLET | Freq: Two times a day (BID) | ORAL | 0 refills | Status: DC

## 2018-10-16 NOTE — Progress Notes (Signed)
BP 119/81   Pulse 80   Temp 97.7 F (36.5 C) (Oral)   Ht 5\' 3"  (1.6 m)   Wt 132 lb 3.2 oz (60 kg)   BMI 23.42 kg/m    Subjective:    Patient ID: Veronica Hayes, female    DOB: 19-Mar-1986, 32 y.o.   MRN: [id number]  HPI: Veronica Hayes is a 32 y.o. female presenting on 10/16/2018 for Dysuria (x 1 week- Patient states that she will nornmaly have Macrobid on hand as proventative after intercose. Patient forgot medications and started have UIT sympotoms and has been taking macrobid BID x 1 week)   HPI Dysuria frequency Patient is coming in for dysuria and frequency that is been going on for 1 week this time.  She says it typically starts after intercourse with her husband and usually she gets it after they have been having more prolonged intercourse rather than just a quickies.  She denies any fevers or chills or flank pain or abdominal pain.  She says she took the Macrobid that she usually has but it did not seem to work this time and she is still having the dysuria burning and frequency.  Relevant past medical, surgical, family and social history reviewed and updated as indicated. Interim medical history since our last visit reviewed. Allergies and medications reviewed and updated.  Review of Systems  Constitutional: Negative for chills and fever.  Eyes: Negative for visual disturbance.  Respiratory: Negative for chest tightness and shortness of breath.   Cardiovascular: Negative for chest pain and leg swelling.  Genitourinary: Positive for dysuria, frequency, hematuria and urgency. Negative for difficulty urinating, flank pain, vaginal bleeding, vaginal discharge and vaginal pain.  Musculoskeletal: Negative for back pain and gait problem.  Skin: Negative for rash.  Neurological: Negative for light-headedness and headaches.  Psychiatric/Behavioral: Negative for agitation and behavioral problems.  All other systems reviewed and are negative.   Per HPI unless specifically indicated  above   Allergies as of 10/16/2018   No Known Allergies     Medication List        Accurate as of 10/16/18  5:10 PM. Always use your most recent med list.          nitrofurantoin (macrocrystal-monohydrate) 100 MG capsule Commonly known as:  MACROBID Take 100 mg by mouth 2 (two) times daily.          Objective:    BP 119/81   Pulse 80   Temp 97.7 F (36.5 C) (Oral)   Ht 5\' 3"  (1.6 m)   Wt 132 lb 3.2 oz (60 kg)   BMI 23.42 kg/m   Wt Readings from Last 3 Encounters:  10/16/18 132 lb 3.2 oz (60 kg)  04/03/18 132 lb 12.8 oz (60.2 kg)  03/11/18 133 lb 6.4 oz (60.5 kg)    Physical Exam  Urinalysis: 0-5 WBCs, 0-2 RBCs, 0-10 epithelial cells, few bacteria    Assessment & Plan:   Problem List Items Addressed This Visit      Genitourinary   URINARY TRACT INFECTION, RECURRENT - Primary   Relevant Medications   ciprofloxacin (CIPRO) 500 MG tablet   Other Relevant Orders   Urinalysis, Complete   Urine Culture      Patient says she frequently gets these urinary tract infections after prolonged intercourse with her husband, recommended that they attempt intercourse starting in the shower first where they can both clean themselves and see if that changes things.  Follow up plan: Return if symptoms worsen or  fail to improve.  Counseling provided for all of the vaccine components Orders Placed This Encounter  Procedures  . Urine Culture  . Urinalysis, Complete    Arville Care, MD [facility] 10/16/2018, 5:10 PM

## 2018-10-17 LAB — URINE CULTURE

## 2019-02-28 DIAGNOSIS — Z01419 Encounter for gynecological examination (general) (routine) without abnormal findings: Secondary | ICD-10-CM | POA: Diagnosis not present

## 2019-02-28 DIAGNOSIS — Z124 Encounter for screening for malignant neoplasm of cervix: Secondary | ICD-10-CM | POA: Diagnosis not present

## 2019-02-28 DIAGNOSIS — Z1151 Encounter for screening for human papillomavirus (HPV): Secondary | ICD-10-CM | POA: Diagnosis not present

## 2019-02-28 DIAGNOSIS — Z6822 Body mass index (BMI) 22.0-22.9, adult: Secondary | ICD-10-CM | POA: Diagnosis not present

## 2019-10-02 ENCOUNTER — Ambulatory Visit (INDEPENDENT_AMBULATORY_CARE_PROVIDER_SITE_OTHER): Payer: BC Managed Care – PPO | Admitting: Family

## 2019-10-02 ENCOUNTER — Encounter: Payer: Self-pay | Admitting: Family

## 2019-10-02 DIAGNOSIS — Z87440 Personal history of urinary (tract) infections: Secondary | ICD-10-CM

## 2019-10-02 MED ORDER — NITROFURANTOIN MONOHYD MACRO 100 MG PO CAPS: ORAL_CAPSULE | ORAL | 11 refills | Status: DC

## 2019-10-02 NOTE — Progress Notes (Signed)
   Virtual Visit via telephone Note Due to COVID-19 pandemic this visit was conducted virtually. This visit type was conducted due to national recommendations for restrictions regarding the COVID-19 Pandemic (e.g. social distancing, sheltering in place) in an effort to limit this patient's exposure and mitigate transmission in our community. All issues noted in this document were discussed and addressed.  A physical exam was not performed with this format.  I connected with Veronica Hayes on 10/02/19 at 10:00 AM by telephone and verified that I am speaking with the correct person using two identifiers. Veronica Hayes is currently located at home and no one is currently with her during visit. The provider, Evelina Dun, FNP is located in their office at time of visit.  I discussed the limitations, risks, security and privacy concerns of performing an evaluation and management service by telephone and the availability of in person appointments. I also discussed with the patient that there may be a patient responsible charge related to this service. The patient expressed understanding and agreed to proceed.   History and Present Illness:  HPI  PT calls the office today to have [place] refilled. She usually takes one 100 mg after intercourse. She has a history of chronic UTI's. She states if she takes this after sex she has not had a UTI since starting it. Denies any dysuria, urinary frequency, fevers, or abdominal pain at this time.    Review of Systems  All other systems reviewed and are negative.    Observations/Objective: No SOB or distress noted  Assessment and Plan: 1. History of recurrent UTIs Will refill rx for the year Force fluids Self hygiene discussed RTO if symptoms worsen or do not improve - nitrofurantoin, macrocrystal-monohydrate, (MACROBID) 100 MG capsule; Take one pill after sex  Dispense: 20 capsule; Refill: 11     I discussed the assessment and treatment plan with the  patient. The patient was provided an opportunity to ask questions and all were answered. The patient agreed with the plan and demonstrated an understanding of the instructions.   The patient was advised to call back or seek an in-person evaluation if the symptoms worsen or if the condition fails to improve as anticipated.  The above assessment and management plan was discussed with the patient. The patient verbalized understanding of and has agreed to the management plan. Patient is aware to call the clinic if symptoms persist or worsen. Patient is aware when to return to the clinic for a follow-up visit. Patient educated on when it is appropriate to go to the emergency department.   Time call ended: 10:08 AM   I provided 8 minutes of non-face-to-face time during this encounter.    Evelina Dun, FNP

## 2020-05-14 ENCOUNTER — Telehealth (INDEPENDENT_AMBULATORY_CARE_PROVIDER_SITE_OTHER): Payer: BC Managed Care – PPO | Admitting: Family Medicine

## 2020-05-14 ENCOUNTER — Encounter: Payer: Self-pay | Admitting: Family Medicine

## 2020-05-14 DIAGNOSIS — J0100 Acute maxillary sinusitis, unspecified: Secondary | ICD-10-CM | POA: Diagnosis not present

## 2020-05-14 MED ORDER — PREDNISONE 10 MG (21) PO TBPK: ORAL_TABLET | ORAL | 0 refills | Status: DC

## 2020-05-14 MED ORDER — AMOXICILLIN-POT CLAVULANATE 875-125 MG PO TABS: 1.0000 | ORAL_TABLET | Freq: Two times a day (BID) | ORAL | 0 refills | Status: DC

## 2020-05-14 NOTE — Progress Notes (Signed)
Virtual Visit via Video note  I connected with Bertis Ruddy on 05/14/20 at 10:16 AM by video and verified that I am speaking with the correct person using two identifiers. Veronica Hayes is currently located at home and her husband is currently with her during visit. The provider, Loman Brooklyn, FNP is located in their office at time of visit.  I discussed the limitations, risks, security and privacy concerns of performing an evaluation and management service by video and the availability of in person appointments. I also discussed with the patient that there may be a patient responsible charge related to this service. The patient expressed understanding and agreed to proceed.  Subjective: PCP: Sharion Balloon, FNP  Chief Complaint  Patient presents with  . Allergies   Patient complains of cough, runny nose, sneezing, ear pain/pressure and facial pain/pressure. Onset of symptoms was a few weeks ago, rapidly worsening since that time. She is drinking plenty of fluids. Evaluation to date: none. Treatment to date: antihistamines. She has a history of allergies. She does not smoke.    ROS: Per HPI  Current Outpatient Medications:  .  nitrofurantoin, macrocrystal-monohydrate, (MACROBID) 100 MG capsule, Take one pill after sex, Disp: 20 capsule, Rfl: 11  No Known Allergies Past Medical History:  Diagnosis Date  . No pertinent past medical history   . PP care - s/p C/S 12/26 12/13/2011  . Preterm contractions 11/19/2011  . Preterm labor    12/2009 34wks    Observations/Objective: Physical Exam Constitutional:      General: She is not in acute distress.    Appearance: Normal appearance. She is not ill-appearing or toxic-appearing.  Eyes:     General: No scleral icterus.       Right eye: No discharge.        Left eye: No discharge.     Conjunctiva/sclera: Conjunctivae normal.  Pulmonary:     Effort: Pulmonary effort is normal. No respiratory distress.  Neurological:     Mental  Status: She is alert and oriented to person, place, and time.  Psychiatric:        Mood and Affect: Mood normal.        Behavior: Behavior normal.        Thought Content: Thought content normal.        Judgment: Judgment normal.    Assessment and Plan: 1. Acute non-recurrent maxillary sinusitis - Discussed symptom management. Encouraged COVID-19 testing but patient declined as she does not work and will just quarantine to be safe.  - amoxicillin-clavulanate (AUGMENTIN) 875-125 MG tablet; Take 1 tablet by mouth 2 (two) times daily.  Dispense: 20 tablet; Refill: 0 - predniSONE (STERAPRED UNI-PAK 21 TAB) 10 MG (21) TBPK tablet; As directed x 6 days  Dispense: 21 tablet; Refill: 0   Follow Up Instructions:   I discussed the assessment and treatment plan with the patient. The patient was provided an opportunity to ask questions and all were answered. The patient agreed with the plan and demonstrated an understanding of the instructions.   The patient was advised to call back or seek an in-person evaluation if the symptoms worsen or if the condition fails to improve as anticipated.  The above assessment and management plan was discussed with the patient. The patient verbalized understanding of and has agreed to the management plan. Patient is aware to call the clinic if symptoms persist or worsen. Patient is aware when to return to the clinic for a follow-up visit. Patient educated on  when it is appropriate to go to the emergency department.   Time call ended: 10:21 AM  I provided 7 minutes of face-to-face time during this encounter.   Deliah Boston, MSN, APRN, FNP-C [facility] 05/14/20

## 2020-08-04 DIAGNOSIS — J209 Acute bronchitis, unspecified: Secondary | ICD-10-CM | POA: Diagnosis not present

## 2020-08-04 DIAGNOSIS — J069 Acute upper respiratory infection, unspecified: Secondary | ICD-10-CM | POA: Diagnosis not present

## 2020-08-09 DIAGNOSIS — U071 COVID-19: Secondary | ICD-10-CM | POA: Diagnosis not present

## 2020-11-23 ENCOUNTER — Ambulatory Visit: Payer: BC Managed Care – PPO | Admitting: Nurse Practitioner

## 2020-12-23 ENCOUNTER — Ambulatory Visit (INDEPENDENT_AMBULATORY_CARE_PROVIDER_SITE_OTHER): Payer: BC Managed Care – PPO | Admitting: Family

## 2020-12-23 ENCOUNTER — Encounter: Payer: Self-pay | Admitting: Family

## 2020-12-23 ENCOUNTER — Other Ambulatory Visit: Payer: Self-pay

## 2020-12-23 VITALS — BP 109/69 | HR 67 | Temp 98.2°F | Ht 63.0 in | Wt 125.4 lb

## 2020-12-23 DIAGNOSIS — Z0000 Encounter for general adult medical examination without abnormal findings: Secondary | ICD-10-CM | POA: Diagnosis not present

## 2020-12-23 DIAGNOSIS — R682 Dry mouth, unspecified: Secondary | ICD-10-CM

## 2020-12-23 DIAGNOSIS — Z1159 Encounter for screening for other viral diseases: Secondary | ICD-10-CM

## 2020-12-23 DIAGNOSIS — H04129 Dry eye syndrome of unspecified lacrimal gland: Secondary | ICD-10-CM

## 2020-12-23 DIAGNOSIS — Z0001 Encounter for general adult medical examination with abnormal findings: Secondary | ICD-10-CM | POA: Diagnosis not present

## 2020-12-23 DIAGNOSIS — Z87440 Personal history of urinary (tract) infections: Secondary | ICD-10-CM | POA: Diagnosis not present

## 2020-12-23 MED ORDER — NITROFURANTOIN MONOHYD MACRO 100 MG PO CAPS: ORAL_CAPSULE | ORAL | 11 refills | Status: DC

## 2020-12-23 NOTE — Progress Notes (Signed)
Subjective:    Patient ID: Veronica Hayes, female    DOB: 07/15/1986, 35 y.o.   MRN: [id number]  Chief Complaint  Patient presents with  . Annual Exam    No pap, hair is falling out. Mouth is staying dry.    HPI PT presents to the office today for CPE with out pap. She is followed by GYN, but has not seen them since COVID.   She had COVID in August and states she noticed increased in hair loss. She started a multivitamin and has noticed an improvement.    She takes Macrobid as needed after sex for recurrent UTI.   She is complaining of chronic dry mouth. She has tried to increase her fluids and chew gum that helps. She reports she has always had dry eyes and was unable to wear contacts.   Review of Systems  All other systems reviewed and are negative.  History reviewed. No pertinent family history. Social History   Socioeconomic History  . Marital status: Married    Spouse name: Not on file  . Number of children: Not on file  . Years of education: Not on file  . Highest education level: Not on file  Occupational History  . Not on file  Tobacco Use  . Smoking status: Never Smoker  . Smokeless tobacco: Never Used  Substance and Sexual Activity  . Alcohol use: No  . Drug use: No  . Sexual activity: Yes  Other Topics Concern  . Not on file  Social History Narrative  . Not on file   Social Determinants of Health   Financial Resource Strain: Not on file  Food Insecurity: Not on file  Transportation Needs: Not on file  Physical Activity: Not on file  Stress: Not on file  Social Connections: Not on file       Objective:   Physical Exam Vitals reviewed.  Constitutional:      General: She is not in acute distress.    Appearance: She is well-developed and well-nourished.  HENT:     Head: Normocephalic and atraumatic.     Right Ear: Tympanic membrane normal.     Left Ear: Tympanic membrane normal.     Mouth/Throat:     Mouth: Oropharynx is clear and moist.   Eyes:     Pupils: Pupils are equal, round, and reactive to light.  Neck:     Thyroid: No thyromegaly.  Cardiovascular:     Rate and Rhythm: Normal rate and regular rhythm.     Pulses: Intact distal pulses.     Heart sounds: Normal heart sounds. No murmur heard.   Pulmonary:     Effort: Pulmonary effort is normal. No respiratory distress.     Breath sounds: Normal breath sounds. No wheezing.  Abdominal:     General: Bowel sounds are normal. There is no distension.     Palpations: Abdomen is soft.     Tenderness: There is no abdominal tenderness.  Musculoskeletal:        General: No tenderness or edema. Normal range of motion.     Cervical back: Normal range of motion and neck supple.  Skin:    General: Skin is warm and dry.  Neurological:     Mental Status: She is alert and oriented to person, place, and time.     Cranial Nerves: No cranial nerve deficit.     Deep Tendon Reflexes: Reflexes are normal and symmetric.  Psychiatric:        Mood  and Affect: Mood and affect normal.        Behavior: Behavior normal.        Thought Content: Thought content normal.        Judgment: Judgment normal.          BP 109/69   Pulse 67   Temp 98.2 F (36.8 C) (Temporal)   Ht _0  (1.6 m)   Wt 125 lb 6.4 oz (56.9 kg)   BMI 22.21 kg/m   Assessment & Plan:  Veronica Hayes comes in today with chief complaint of Annual Exam (No pap, hair is falling out. Mouth is staying dry.)   Diagnosis and orders addressed:  1. History of recurrent UTIs - nitrofurantoin, macrocrystal-monohydrate, (MACROBID) 100 MG capsule; Take one pill after sex  Dispense: 20 capsule; Refill: 11 - CMP14+EGFR - ANA - Sedimentation Rate - Sjogren's syndrome antibods(ssa + ssb)  2. Annual physical exam - Hepatitis C antibody - Anemia Profile B - CMP14+EGFR - Lipid panel - TSH  3. Need for hepatitis C screening test - Hepatitis C antibody - CMP14+EGFR  4. Dry mouth - ANA - Sedimentation Rate -  Sjogren's syndrome antibods(ssa + ssb)  5. Dry eye - ANA - Sedimentation Rate - Sjogren's syndrome antibods(ssa + ssb)  Given symptoms, I believe she is having Sjogren's? Labs pending. Until continue with chewing gum and staying hydrated. Will do referral to Rheumatologists if labs are positive.  Labs pending Health Maintenance reviewed Diet and exercise encouraged  Follow up plan: 1 year    Evelina Dun, FNP

## 2020-12-23 NOTE — Patient Instructions (Signed)
Sjgren's Syndrome Sjgren's syndrome is a disease in which the body's disease-fighting system (immune system) attacks the glands that produce tears (lacrimal glands) and the glands that produce saliva (salivary glands). This makes the eyes and mouth very dry. Sjgren's syndrome is a long-term (chronic) disorder that has no cure. In some cases, it is linked to other disorders (rheumatic disorders), such as rheumatoid arthritis and systemic lupus erythematosus (SLE). It may affect other parts of the body, such as the:  Kidneys.  Blood vessels.  Joints.  Lungs.  Liver.  Pancreas.  Brain.  Nerves.  Spinal cord. What are the causes? The cause of this condition is not known. It may be passed along from parent to child (inherited), or it may be a symptom of a rheumatic disorder. What increases the risk? This condition is more likely to develop in:  Women.  People who are 45-50 years old.  People who have recently had a viral infection or currently have a viral infection. What are the signs or symptoms? The main symptoms of this condition are:  Dry mouth. This may include: ? A chalky feeling. ? Difficulty swallowing, speaking, or tasting. ? Frequent cavities in the teeth. ? Frequent mouth infections.  Dry eyes. This may include: ? Burning, redness, and itching. ? Blurry vision. ? Light sensitivity. Other symptoms may include:  Dryness of the skin and the inside of the nose.  Eyelid infections.  Vaginal dryness, if this applies.  Joint pain and stiffness.  Muscle pain and stiffness. How is this diagnosed? This condition is diagnosed based on:  Your symptoms.  Your medical history.  A physical exam of your eyes and mouth.  Tests, including: ? A Schirmer test. This tests your tear production. ? An eye exam that is done with a magnifying device (slit-lamp exam). ? An eye test that temporarily stains your eye with dye. This shows the extent of eye  damage. ? Tests to check your salivary gland function. ? Biopsy. This is a removal of part of a salivary gland from inside your lower lip to be studied under a microscope. ? Chest X-rays. ? Blood tests. ? Urine tests. How is this treated? There is no cure for this condition, but treatment can help you manage your symptoms. This condition may be treated with:  Moisture replacement therapies to help relieve dryness in your skin, mouth, and eyes.  Medicines to help relieve pain and stiffness.  Medicines to help relieve inflammation in your body (corticosteroids). These are usually for severe cases.  Medicines to help reduce the activity of your immune system (immunosuppressants).  Surgery or insertion of plugs to close the lacrimal glands (punctal occlusion). This helps keep more natural tears in your eyes. Follow these instructions at home: Eye care   Use eye drops as told by your health care provider.  Protect your eyes from the sun and wind with sunglasses or glasses.  Blink at least 5-6 times a minute.  Maintain properly humidified air. You may want to use a humidifier at home.  Avoid smoke. Mouth care  Brush your teeth and floss after every meal.  Chew sugar-free gum or suck on hard candy. This may help to relieve dry mouth.  Use antimicrobial mouthwash daily.  Take frequent sips of water or sugar-free drinks.  Use saliva substitutes or lip balm as told by your health care provider.  Schedule and attend dentist visits every 6 months. General instructions   Take over-the-counter and prescription medicines only as told by   your health care provider.  Drink enough fluid to keep your urine pale yellow.  Keep all follow-up visits as told by your health care provider. This is important. Contact a health care provider if:  You have a fever.  You have night sweats.  You are always tired.  You have unexplained weight loss.  You develop itchy skin.  You have red  patches on your skin.  You have a lump or swelling on your neck. Summary  Sjgren's syndrome is a disease in which the body's disease-fighting system attacks the glands that produce tears and the glands that produce saliva.  This condition makes the eyes and mouth very dry.  Sjgren's syndrome is a long-term (chronic) disorder that has no cure.  The cause of this condition is not known.  There is no cure for this condition, but treatment can help you manage your symptoms. This information is not intended to replace advice given to you by your health care provider. Make sure you discuss any questions you have with your health care provider. Document Revised: 10/10/2018 Document Reviewed: 10/10/2018 Elsevier Patient Education  2020 Elsevier Inc.  

## 2020-12-24 LAB — LIPID PANEL
Chol/HDL Ratio: 2.5 ratio (ref 0.0–4.4)
Cholesterol, Total: 160 mg/dL (ref 100–199)
HDL: 64 mg/dL (ref >39)
LDL Chol Calc (NIH): 80 mg/dL (ref 0–99)
Triglycerides: 85 mg/dL (ref 0–149)
VLDL Cholesterol Cal: 16 mg/dL (ref 5–40)

## 2020-12-24 LAB — CMP14+EGFR
ALT: 16 IU/L (ref 0–32)
AST: 16 IU/L (ref 0–40)
Albumin/Globulin Ratio: 1.8 (ref 1.2–2.2)
Albumin: 4.6 g/dL (ref 3.8–4.8)
Alkaline Phosphatase: 53 IU/L (ref 44–121)
BUN/Creatinine Ratio: 13 (ref 9–23)
BUN: 9 mg/dL (ref 6–20)
Bilirubin Total: 0.2 mg/dL (ref 0.0–1.2)
CO2: 25 mmol/L (ref 20–29)
Calcium: 9.5 mg/dL (ref 8.7–10.2)
Chloride: 102 mmol/L (ref 96–106)
Creatinine, Ser: 0.72 mg/dL (ref 0.57–1.00)
GFR calc Af Amer: 125 mL/min/{1.73_m2} (ref >59)
GFR calc non Af Amer: 109 mL/min/{1.73_m2} (ref >59)
Globulin, Total: 2.5 g/dL (ref 1.5–4.5)
Glucose: 85 mg/dL (ref 65–99)
Potassium: 4.6 mmol/L (ref 3.5–5.2)
Sodium: 141 mmol/L (ref 134–144)
Total Protein: 7.1 g/dL (ref 6.0–8.5)

## 2020-12-24 LAB — ANEMIA PROFILE B
Basophils Absolute: 0 10*3/uL (ref 0.0–0.2)
Basos: 0 %
EOS (ABSOLUTE): 0.1 10*3/uL (ref 0.0–0.4)
Eos: 2 %
Ferritin: 51 ng/mL (ref 15–150)
Folate: 10.1 ng/mL (ref >3.0)
Hematocrit: 40.1 % (ref 34.0–46.6)
Hemoglobin: 13.9 g/dL (ref 11.1–15.9)
Immature Grans (Abs): 0 10*3/uL (ref 0.0–0.1)
Immature Granulocytes: 0 %
Iron Saturation: 28 % (ref 15–55)
Iron: 85 ug/dL (ref 27–159)
Lymphocytes Absolute: 1.6 10*3/uL (ref 0.7–3.1)
Lymphs: 27 %
MCH: 30.6 pg (ref 26.6–33.0)
MCHC: 34.7 g/dL (ref 31.5–35.7)
MCV: 88 fL (ref 79–97)
Monocytes Absolute: 0.5 10*3/uL (ref 0.1–0.9)
Monocytes: 8 %
Neutrophils Absolute: 3.7 10*3/uL (ref 1.4–7.0)
Neutrophils: 63 %
Platelets: 229 10*3/uL (ref 150–450)
RBC: 4.54 x10E6/uL (ref 3.77–5.28)
RDW: 11.8 % (ref 11.7–15.4)
Retic Ct Pct: 1.3 % (ref 0.6–2.6)
Total Iron Binding Capacity: 309 ug/dL (ref 250–450)
UIBC: 224 ug/dL (ref 131–425)
Vitamin B-12: 308 pg/mL (ref 232–1245)
WBC: 5.9 10*3/uL (ref 3.4–10.8)

## 2020-12-24 LAB — HEPATITIS C ANTIBODY: Hep C Virus Ab: <0.1 s/co ratio (ref 0.0–0.9)

## 2020-12-24 LAB — TSH: TSH: 1.33 u[IU]/mL (ref 0.450–4.500)

## 2020-12-24 LAB — SJOGREN'S SYNDROME ANTIBODS(SSA + SSB)
ENA SSA (RO) Ab: >8 AI — ABNORMAL HIGH (ref 0.0–0.9)
ENA SSB (LA) Ab: <0.2 AI (ref 0.0–0.9)

## 2020-12-24 LAB — ANA: Anti Nuclear Antibody (ANA): POSITIVE — AB

## 2020-12-24 LAB — SEDIMENTATION RATE: Sed Rate: 2 mm/hr (ref 0–32)

## 2020-12-28 ENCOUNTER — Other Ambulatory Visit: Payer: Self-pay | Admitting: Family

## 2020-12-28 DIAGNOSIS — M3500 Sicca syndrome, unspecified: Secondary | ICD-10-CM

## 2021-01-25 DIAGNOSIS — L659 Nonscarring hair loss, unspecified: Secondary | ICD-10-CM | POA: Diagnosis not present

## 2021-01-25 DIAGNOSIS — M3500 Sicca syndrome, unspecified: Secondary | ICD-10-CM | POA: Diagnosis not present

## 2021-01-25 DIAGNOSIS — R768 Other specified abnormal immunological findings in serum: Secondary | ICD-10-CM | POA: Diagnosis not present

## 2021-04-14 ENCOUNTER — Encounter: Payer: Self-pay | Admitting: Family

## 2021-07-12 DIAGNOSIS — M3500 Sicca syndrome, unspecified: Secondary | ICD-10-CM | POA: Diagnosis not present

## 2021-07-19 DIAGNOSIS — Z113 Encounter for screening for infections with a predominantly sexual mode of transmission: Secondary | ICD-10-CM | POA: Diagnosis not present

## 2021-07-19 DIAGNOSIS — Z6822 Body mass index (BMI) 22.0-22.9, adult: Secondary | ICD-10-CM | POA: Diagnosis not present

## 2021-07-19 DIAGNOSIS — Z124 Encounter for screening for malignant neoplasm of cervix: Secondary | ICD-10-CM | POA: Diagnosis not present

## 2021-07-19 DIAGNOSIS — Z01419 Encounter for gynecological examination (general) (routine) without abnormal findings: Secondary | ICD-10-CM | POA: Diagnosis not present

## 2021-11-28 ENCOUNTER — Encounter: Payer: Self-pay | Admitting: Family

## 2021-11-28 ENCOUNTER — Ambulatory Visit (INDEPENDENT_AMBULATORY_CARE_PROVIDER_SITE_OTHER): Payer: BC Managed Care – PPO | Admitting: Family

## 2021-11-28 VITALS — BP 108/66 | HR 77 | Temp 98.1°F | Ht 63.0 in | Wt 125.0 lb

## 2021-11-28 DIAGNOSIS — F419 Anxiety disorder, unspecified: Secondary | ICD-10-CM

## 2021-11-28 DIAGNOSIS — R197 Diarrhea, unspecified: Secondary | ICD-10-CM

## 2021-11-28 NOTE — Patient Instructions (Signed)
Diarrhea, Adult ?Diarrhea is frequent loose and watery bowel movements. Diarrhea can make you feel weak and cause you to become dehydrated. Dehydration can make you tired and thirsty, cause you to have a dry mouth, and decrease how often you urinate. ?Diarrhea typically lasts 2-3 days. However, it can last longer if it is a sign of something more serious. It is important to treat your diarrhea as told by your health care provider. ?Follow these instructions at home: ?Eating and drinking ?  ?Follow these recommendations as told by your health care provider: ?Take an oral rehydration solution (ORS). This is an over-the-counter medicine that helps return your body to its normal balance of nutrients and water. It is found at pharmacies and retail stores. ?Drink plenty of fluids, such as water, ice chips, diluted fruit juice, and low-calorie sports drinks. You can drink milk also, if desired. ?Avoid drinking fluids that contain a lot of sugar or caffeine, such as energy drinks, sports drinks, and soda. ?Eat bland, easy-to-digest foods in small amounts as you are able. These foods include bananas, applesauce, rice, lean meats, toast, and crackers. ?Avoid alcohol. ?Avoid spicy or fatty foods. ? ?Medicines ?Take over-the-counter and prescription medicines only as told by your health care provider. ?If you were prescribed an antibiotic medicine, take it as told by your health care provider. Do not stop using the antibiotic even if you start to feel better. ?General instructions ? ?Wash your hands often using soap and water. If soap and water are not available, use a hand sanitizer. Others in the household should wash their hands as well. Hands should be washed: ?After using the toilet or changing a diaper. ?Before preparing, cooking, or serving food. ?While caring for a sick person or while visiting someone in a hospital. ?Drink enough fluid to keep your urine pale yellow. ?Rest at home while you recover. ?Watch your  condition for any changes. ?Take a warm bath to relieve any burning or pain from frequent diarrhea episodes. ?Keep all follow-up visits as told by your health care provider. This is important. ?Contact a health care provider if: ?You have a fever. ?Your diarrhea gets worse. ?You have new symptoms. ?You cannot keep fluids down. ?You feel light-headed or dizzy. ?You have a headache. ?You have muscle cramps. ?Get help right away if: ?You have chest pain. ?You feel extremely weak or you faint. ?You have bloody or black stools or stools that look like tar. ?You have severe pain, cramping, or bloating in your abdomen. ?You have trouble breathing or you are breathing very quickly. ?Your heart is beating very quickly. ?Your skin feels cold and clammy. ?You feel confused. ?You have signs of dehydration, such as: ?Dark urine, very little urine, or no urine. ?Cracked lips. ?Dry mouth. ?Sunken eyes. ?Sleepiness. ?Weakness. ?Summary ?Diarrhea is frequent loose and sometimes watery bowel movements. Diarrhea can make you feel weak and cause you to become dehydrated. ?Drink enough fluids to keep your urine pale yellow. ?Make sure that you wash your hands after using the toilet. If soap and water are not available, use hand sanitizer. ?Contact a health care provider if your diarrhea gets worse or you have new symptoms. ?Get help right away if you have signs of dehydration. ?This information is not intended to replace advice given to you by your health care provider. Make sure you discuss any questions you have with your health care provider. ?Document Revised: 06/15/2021 Document Reviewed: 06/15/2021 ?Elsevier Patient Education ? 2022 Elsevier Inc. ? ?

## 2021-11-28 NOTE — Progress Notes (Signed)
Subjective:    Patient ID: Veronica Hayes, female    DOB: 1986/01/11, 35 y.o.   MRN: [id number]  Chief Complaint  Patient presents with   Diarrhea    Wakes up at night 2-3 hours about twice a week. Started a couple of months ago. She feels her heart go fast when this happens and not sure if its making her nervous  gets chills.   Pt presents to the office with complaints of diarrhea in the middle of the night that occurs 2-3 times a week. She reports she will wake up with heart palpitations and diarrhea that lasts 3 hours. She has tracked her foods and thought it was related to eating out, however, it has happened twice eating at home. Denies any problems during the day and only during the night. She reports by morning all her symptoms will be resolved.   Denies any fevers, abdominal pain. She has tried to limit sugary and greasy foods with no relief.   Diarrhea  This is a new problem. The current episode started more than 1 month ago. The problem has been waxing and waning. The stool consistency is described as Watery. Associated symptoms include bloating. Pertinent negatives include no chills, coughing, fever, headaches or myalgias. The symptoms are aggravated by stress.  Anxiety Presents for initial visit. Symptoms include excessive worry, nervous/anxious behavior and palpitations. Patient reports no hyperventilation, irritability or restlessness. Symptoms occur occasionally.       Review of Systems  Constitutional:  Negative for chills, fever and irritability.  Respiratory:  Negative for cough.   Cardiovascular:  Positive for palpitations.  Gastrointestinal:  Positive for bloating and diarrhea.  Musculoskeletal:  Negative for myalgias.  Neurological:  Negative for headaches.  Psychiatric/Behavioral:  The patient is nervous/anxious.   All other systems reviewed and are negative.     Objective:   Physical Exam Vitals reviewed.  Constitutional:      General: She is not in acute  distress.    Appearance: She is well-developed.  HENT:     Head: Normocephalic and atraumatic.  Eyes:     Pupils: Pupils are equal, round, and reactive to light.  Neck:     Thyroid: No thyromegaly.  Cardiovascular:     Rate and Rhythm: Normal rate and regular rhythm.     Heart sounds: Normal heart sounds. No murmur heard. Pulmonary:     Effort: Pulmonary effort is normal. No respiratory distress.     Breath sounds: Normal breath sounds. No wheezing.  Abdominal:     General: Bowel sounds are normal. There is no distension.     Palpations: Abdomen is soft.     Tenderness: There is no abdominal tenderness.  Musculoskeletal:        General: No tenderness. Normal range of motion.     Cervical back: Normal range of motion and neck supple.  Skin:    General: Skin is warm and dry.  Neurological:     Mental Status: She is alert and oriented to person, place, and time.     Cranial Nerves: No cranial nerve deficit.     Deep Tendon Reflexes: Reflexes are normal and symmetric.  Psychiatric:        Behavior: Behavior normal.        Thought Content: Thought content normal.        Judgment: Judgment normal.     BP 108/66   Pulse 77   Temp 98.1 F (36.7 C) (Temporal)   Ht _0  (1.6  m)   Wt 125 lb (56.7 kg)   BMI 22.14 kg/m       Assessment & Plan:  Veronica Hayes comes in today with chief complaint of Diarrhea (Wakes up at night 2-3 hours about twice a week. Started a couple of months ago. She feels her heart go fast when this happens and not sure if its making her nervous  gets chills.)   Diagnosis and orders addressed:  1. Diarrhea, unspecified type - Cdiff NAA+O+P+Stool Culture - TSH - CMP14+EGFR - CBC with Differential/Platelet  2. Anxiety - Cdiff NAA+O+P+Stool Culture - TSH - CMP14+EGFR - CBC with Differential/Platelet   Labs pending Continue to do food journal  If symptoms continue and labs are negative, we will try low dose lexapro to see if this helps.   Imodium as needed Force fluids Approx 28 mins spent reviewing chart, discussing with patient.   Veronica Dun, FNP

## 2021-11-29 LAB — CMP14+EGFR
ALT: 13 IU/L (ref 0–32)
AST: 16 IU/L (ref 0–40)
Albumin/Globulin Ratio: 1.9 (ref 1.2–2.2)
Albumin: 4.6 g/dL (ref 3.8–4.8)
Alkaline Phosphatase: 46 IU/L (ref 44–121)
BUN/Creatinine Ratio: 18 (ref 9–23)
BUN: 13 mg/dL (ref 6–20)
Bilirubin Total: 0.4 mg/dL (ref 0.0–1.2)
CO2: 22 mmol/L (ref 20–29)
Calcium: 9.4 mg/dL (ref 8.7–10.2)
Chloride: 105 mmol/L (ref 96–106)
Creatinine, Ser: 0.73 mg/dL (ref 0.57–1.00)
Globulin, Total: 2.4 g/dL (ref 1.5–4.5)
Glucose: 62 mg/dL — ABNORMAL LOW (ref 70–99)
Potassium: 4.3 mmol/L (ref 3.5–5.2)
Sodium: 141 mmol/L (ref 134–144)
Total Protein: 7 g/dL (ref 6.0–8.5)
eGFR: 109 mL/min/1.73

## 2021-11-29 LAB — CBC WITH DIFFERENTIAL/PLATELET
Basophils Absolute: 0 10*3/uL (ref 0.0–0.2)
Basos: 0 %
EOS (ABSOLUTE): 0.1 10*3/uL (ref 0.0–0.4)
Eos: 3 %
Hematocrit: 40 % (ref 34.0–46.6)
Hemoglobin: 13.6 g/dL (ref 11.1–15.9)
Immature Grans (Abs): 0 10*3/uL (ref 0.0–0.1)
Immature Granulocytes: 0 %
Lymphocytes Absolute: 1.3 10*3/uL (ref 0.7–3.1)
Lymphs: 29 %
MCH: 30 pg (ref 26.6–33.0)
MCHC: 34 g/dL (ref 31.5–35.7)
MCV: 88 fL (ref 79–97)
Monocytes Absolute: 0.5 10*3/uL (ref 0.1–0.9)
Monocytes: 10 %
Neutrophils Absolute: 2.6 10*3/uL (ref 1.4–7.0)
Neutrophils: 58 %
Platelets: 236 10*3/uL (ref 150–450)
RBC: 4.53 x10E6/uL (ref 3.77–5.28)
RDW: 11.8 % (ref 11.7–15.4)
WBC: 4.5 10*3/uL (ref 3.4–10.8)

## 2021-11-29 LAB — TSH: TSH: 1.61 u[IU]/mL (ref 0.450–4.500)

## 2022-01-02 ENCOUNTER — Encounter: Payer: Self-pay | Admitting: Family Medicine

## 2022-01-02 DIAGNOSIS — M3500 Sicca syndrome, unspecified: Secondary | ICD-10-CM | POA: Insufficient documentation

## 2022-02-14 ENCOUNTER — Encounter: Payer: Self-pay | Admitting: Nurse Practitioner

## 2022-02-14 ENCOUNTER — Ambulatory Visit (INDEPENDENT_AMBULATORY_CARE_PROVIDER_SITE_OTHER): Payer: BC Managed Care – PPO | Admitting: Nurse Practitioner

## 2022-02-14 DIAGNOSIS — J029 Acute pharyngitis, unspecified: Secondary | ICD-10-CM | POA: Diagnosis not present

## 2022-02-14 DIAGNOSIS — R051 Acute cough: Secondary | ICD-10-CM | POA: Diagnosis not present

## 2022-02-14 DIAGNOSIS — R0989 Other specified symptoms and signs involving the circulatory and respiratory systems: Secondary | ICD-10-CM

## 2022-02-14 LAB — RAPID STREP SCREEN (MED CTR MEBANE ONLY): Strep Gp A Ag, IA W/Reflex: NEGATIVE

## 2022-02-14 LAB — CULTURE, GROUP A STREP

## 2022-02-14 MED ORDER — PSEUDOEPH-BROMPHEN-DM 30-2-10 MG/5ML PO SYRP: 5.0000 mL | ORAL_SOLUTION | Freq: Four times a day (QID) | ORAL | 0 refills | Status: DC | PRN

## 2022-02-14 NOTE — Progress Notes (Signed)
° °  Virtual Visit  Note Due to COVID-19 pandemic this visit was conducted virtually. This visit type was conducted due to national recommendations for restrictions regarding the COVID-19 Pandemic (e.g. social distancing, sheltering in place) in an effort to limit this patient's exposure and mitigate transmission in our community. All issues noted in this document were discussed and addressed.  A physical exam was not performed with this format.  I connected with Veronica Hayes on 02/14/22 at 1:30 pm  by telephone and verified that I am speaking with the correct person using two identifiers. Veronica Hayes is currently located at home during visit. The provider, Daryll Drown, NP is located in their office at time of visit.  I discussed the limitations, risks, security and privacy concerns of performing an evaluation and management service by telephone and the availability of in person appointments. I also discussed with the patient that there may be a patient responsible charge related to this service. The patient expressed understanding and agreed to proceed.   History and Present Illness:  Sore Throat  This is a new problem. The current episode started yesterday. The problem has been gradually worsening. There has been no fever. The pain is moderate. Associated symptoms include congestion and swollen glands. She has had no exposure to strep.     Review of Systems  Constitutional:  Negative for chills and fever.  HENT:  Positive for congestion and sore throat.   Respiratory: Negative.    Cardiovascular: Negative.   All other systems reviewed and are negative.   Observations/Objective: Tele-visit patient not distress  Assessment and Plan: Take meds as prescribed - Use a cool mist humidifier  -Use saline nose sprays frequently -Force fluids -For fever or aches or pains- take Tylenol or ibuprofen. -strep test completed results pending -If symptoms do not improve, she may need to be COVID  tested to rule this out   Follow Up Instructions:  Follow up with worsening unresolved symptoms    I discussed the assessment and treatment plan with the patient. The patient was provided an opportunity to ask questions and all were answered. The patient agreed with the plan and demonstrated an understanding of the instructions.   The patient was advised to call back or seek an in-person evaluation if the symptoms worsen or if the condition fails to improve as anticipated.  The above assessment and management plan was discussed with the patient. The patient verbalized understanding of and has agreed to the management plan. Patient is aware to call the clinic if symptoms persist or worsen. Patient is aware when to return to the clinic for a follow-up visit. Patient educated on when it is appropriate to go to the emergency department.   Time call ended:  1:40  I provided 10 minutes of  non face-to-face time during this encounter.    Daryll Drown, NP

## 2022-02-14 NOTE — Patient Instructions (Signed)
Sore Throat When you have a sore throat, your throat may feel: Tender. Burning. Irritated. Scratchy. Painful when you swallow. Painful when you talk. Many things can cause a sore throat, such as: An infection. Allergies. Dry air. Smoke or pollution. Radiation treatment for cancer. Gastroesophageal reflux disease (GERD). A tumor. A sore throat can be the first sign of another sickness. It can happen with other problems, like: Coughing. Sneezing. Fever. Swelling of the glands in the neck. Most sore throats go away without treatment. Follow these instructions at home:   Medicines Take over-the-counter and prescription medicines only as told by your doctor. Children often get sore throats. Do not give your child aspirin. Use throat sprays to soothe your throat as told by your health care provider. Managing pain To help with pain: Sip warm liquids, such as broth, herbal tea, or warm water. Eat or drink cold or frozen liquids, such as frozen ice pops. Rinse your mouth (gargle) with a salt water mixture 3-4 times a day or as needed. To make salt water, dissolve -1 tsp (3-6 g) of salt in 1 cup (237 mL) of warm water. Do not swallow this mixture. Suck on hard candy or throat lozenges. Put a cool-mist humidifier in your bedroom at night. Sit in the bathroom with the door closed for 5-10 minutes while you run hot water in the shower. General instructions Do not smoke or use any products that contain nicotine or tobacco. If you need help quitting, ask your doctor. Get plenty of rest. Drink enough fluid to keep your pee (urine) pale yellow. Wash your hands often for at least 20 seconds with soap and water. If soap and water are not available, use hand sanitizer. Contact a doctor if: You have a fever for more than 2-3 days. You keep having symptoms for more than 2-3 days. Your throat does not get better in 7 days. You have a fever and your symptoms suddenly get worse. Your child  who is 3 months to 3 years old has a temperature of 102.2F (39C) or higher. Get help right away if: You have trouble breathing. You cannot swallow fluids, soft foods, or your spit. You have swelling in your throat or neck that gets worse. You feel like you may vomit (nauseous) and this feeling lasts a long time. You cannot stop vomiting. These symptoms may be an emergency. Get help right away. Call your local emergency services (911 in the [place] Do not wait to see if the symptoms will go away. Do not drive yourself to the hospital. Summary A sore throat is a painful, burning, irritated, or scratchy throat. Many things can cause a sore throat. Take over-the-counter medicines only as told by your doctor. Get plenty of rest. Drink enough fluid to keep your pee (urine) pale yellow. Contact a doctor if your symptoms get worse or your sore throat does not get better within 7 days. This information is not intended to replace advice given to you by your health care provider. Make sure you discuss any questions you have with your health care provider. Document Revised: 03/02/2021 Document Reviewed: 03/02/2021 Elsevier Patient Education  2022 [employer] Inc.  

## 2022-11-06 ENCOUNTER — Encounter: Payer: Self-pay | Admitting: Family

## 2022-11-06 ENCOUNTER — Ambulatory Visit (INDEPENDENT_AMBULATORY_CARE_PROVIDER_SITE_OTHER): Payer: BC Managed Care – PPO | Admitting: Family

## 2022-11-06 VITALS — BP 110/58 | HR 81 | Temp 98.8°F | Wt 124.0 lb

## 2022-11-06 DIAGNOSIS — Z0000 Encounter for general adult medical examination without abnormal findings: Secondary | ICD-10-CM

## 2022-11-06 DIAGNOSIS — R5383 Other fatigue: Secondary | ICD-10-CM | POA: Diagnosis not present

## 2022-11-06 DIAGNOSIS — R400 Somnolence: Secondary | ICD-10-CM

## 2022-11-06 DIAGNOSIS — M3500 Sicca syndrome, unspecified: Secondary | ICD-10-CM | POA: Diagnosis not present

## 2022-11-06 DIAGNOSIS — R197 Diarrhea, unspecified: Secondary | ICD-10-CM | POA: Diagnosis not present

## 2022-11-06 DIAGNOSIS — Z0001 Encounter for general adult medical examination with abnormal findings: Secondary | ICD-10-CM

## 2022-11-06 DIAGNOSIS — Z136 Encounter for screening for cardiovascular disorders: Secondary | ICD-10-CM | POA: Diagnosis not present

## 2022-11-06 DIAGNOSIS — N3000 Acute cystitis without hematuria: Secondary | ICD-10-CM | POA: Diagnosis not present

## 2022-11-06 DIAGNOSIS — Z87440 Personal history of urinary (tract) infections: Secondary | ICD-10-CM

## 2022-11-06 MED ORDER — NITROFURANTOIN MONOHYD MACRO 100 MG PO CAPS: ORAL_CAPSULE | ORAL | 11 refills | Status: AC

## 2022-11-06 NOTE — Patient Instructions (Addendum)
Hypersomnia Hypersomnia is a condition in which a person feels very tired during the day even though the person gets plenty of sleep at night. A person with this condition may take naps during the day and may find it very difficult to wake up from sleep. Hypersomnia may affect a person's ability to think, concentrate, drive, or remember things. What are the causes? The cause of this condition may not be known. Possible causes include: Taking certain medicines. Using drugs or alcohol. Sleep disorders, such as narcolepsy and sleep apnea. Injury to the head, brain, or spinal cord. Tumors. Certain medical conditions. These include: Depression. Diabetes. Gastroesophageal reflux disease (GERD). An underactive thyroid gland (hypothyroidism). What are the signs or symptoms? The main symptoms of hypersomnia include: Feeling very tired throughout the day, regardless of how much sleep you got the night before. Having trouble waking up. Others may find it difficult to wake you up when you are sleeping. Sleeping for longer and longer periods at a time. Taking naps throughout the day. Other symptoms may include: Feeling restless, anxious, or annoyed. Lacking energy. Having trouble with: Remembering. Speaking. Thinking. Loss of appetite. Seeing, hearing, tasting, smelling, or feeling things that are not real (hallucinations). How is this diagnosed? This condition may be diagnosed based on: Your symptoms and medical history. Your sleeping habits. Your health care provider may ask you to write down your sleeping habits in a daily sleep log, along with any symptoms you have. A series of tests that are done while you sleep (sleep study or polysomnogram). A test that measures how quickly you can fall asleep during the day (daytime nap study or multiple sleep latency test). How is this treated? This condition may be treated by: Following a regular sleep routine. Making lifestyle changes, such as  changing your eating habits, getting regular exercise, and avoiding alcohol or caffeinated beverages. Taking medicines to make you more alert (stimulants) during the day. Treating any underlying medical causes of hypersomnia. Follow these instructions at home: Sleep habits Stick to a routine that includes going to bed and waking up at the same times every day and night. Practice a relaxing bedtime routine. This may include reading, meditation, deep breathing, or taking a warm bath before going to sleep. Exercise regularly as told by your health care provider. However, avoid exercising in the hours right before bedtime. Keep your sleep environment at a cooler temperature, darkened, and quiet. Sleep with pillows and a mattress that are comfortable and supportive. Schedule short 20-minute naps for when you feel sleepiest during the day. Talk with your employer or teachers about your hypersomnia. If possible, adjust your schedule so that: You have a regular daytime work schedule. You can take a scheduled nap during the day. You do not have to work or be active at night. Do not eat a heavy meal for a few hours before bedtime. Eat your meals at about the same times every day. Safety  Do not drive or use machinery if you are sleepy. Ask your health care provider if it is safe for you to drive. Wear a life jacket when swimming or spending time near water. General instructions  Take over-the-counter and prescription medicines only as told by your health care provider. This includes supplements. Avoid drinking alcohol or caffeinated beverages. Keep a sleep log that will help your health care provider manage your condition. This may include information about: What time you go to bed each night. How often you wake up at night. How many hours   you sleep at night. How often and for how long you nap during the day. Any observations from others, such as leg movements during sleep, sleep walking, or  snoring. Keep all follow-up visits. This is important. Contact a health care provider if: You have new symptoms. Your symptoms get worse. Get help right away if: You have thoughts about hurting yourself or someone else. Get help right away if you feel like you may hurt yourself or others, or have thoughts about taking your own life. Go to your nearest emergency room or: Call 911. Call the National Suicide Prevention Lifeline at 1-800-273-8255 or 988. This is open 24 hours a day. Text the Crisis Text Line at 741741. Summary Hypersomnia refers to a condition in which you feel very tired during the day even though you get plenty of sleep at night. A person with this condition may take naps during the day and may find it very difficult to wake up from sleep. Hypersomnia may affect a person's ability to think, concentrate, drive, or remember things. Treatment may include a regular sleep routine and making some lifestyle changes. This information is not intended to replace advice given to you by your health care provider. Make sure you discuss any questions you have with your health care provider. Document Revised: 11/14/2021 Document Reviewed: 11/14/2021 Elsevier Patient Education  2023 Elsevier Inc.  

## 2022-11-06 NOTE — Progress Notes (Signed)
Subjective:    Patient ID: Veronica Hayes, female    DOB: Mar 29, 1986, 36 y.o.   MRN: [id number]  Chief Complaint  Patient presents with   Annual Exam    Diarrhea from last year better she changed her diet    Fatigue    Patient can fall asleep easy sleeps good. She is having a hard     HPI PT presents to the office today for CPE with out pap. She is followed by GYN.   She continues to have diarrhea as needed if she eats greasy foods.   She takes Macrobid as needed after sex for recurrent UTI.    She has Sjogren syndrome with  chronic dry mouth. She has tried to increase her fluids and chew gum that helps. She reports she has always had dry eyes and was unable to wear contacts.   She is complaining of fatigue and increased excessive day time sleepy.    Review of Systems  All other systems reviewed and are negative.      Objective:   Physical Exam Vitals reviewed.  Constitutional:      General: She is not in acute distress.    Appearance: She is well-developed.  HENT:     Head: Normocephalic and atraumatic.     Right Ear: Tympanic membrane normal.     Left Ear: Tympanic membrane normal.  Eyes:     Pupils: Pupils are equal, round, and reactive to light.  Neck:     Thyroid: No thyromegaly.  Cardiovascular:     Rate and Rhythm: Normal rate and regular rhythm.     Heart sounds: Normal heart sounds. No murmur heard. Pulmonary:     Effort: Pulmonary effort is normal. No respiratory distress.     Breath sounds: Normal breath sounds. No wheezing.  Abdominal:     General: Bowel sounds are normal. There is no distension.     Palpations: Abdomen is soft.     Tenderness: There is no abdominal tenderness.  Musculoskeletal:        General: No tenderness. Normal range of motion.     Cervical back: Normal range of motion and neck supple.  Skin:    General: Skin is warm and dry.  Neurological:     Mental Status: She is alert and oriented to person, place, and time.     Cranial  Nerves: No cranial nerve deficit.     Deep Tendon Reflexes: Reflexes are normal and symmetric.  Psychiatric:        Behavior: Behavior normal.        Thought Content: Thought content normal.        Judgment: Judgment normal.       BP (!) 110/58   Pulse 81   Temp 98.8 F (37.1 C) (Temporal)   Wt 124 lb (56.2 kg)   SpO2 96%   BMI 21.97 kg/m      Assessment & Plan:  Veronica Hayes comes in today with chief complaint of Annual Exam (Diarrhea from last year better she changed her diet ) and Fatigue (Patient can fall asleep easy sleeps good. She is having a hard )   Diagnosis and orders addressed:  1. Annual physical exam - CMP14+EGFR - CBC with Differential/Platelet - Lipid panel - Vitamin B12 - Thyroid Panel With TSH - Iron, TIBC and Ferritin Panel  2. Sjogren's syndrome, with unspecified organ involvement ([place]) - CMP14+EGFR - CBC with Differential/Platelet  3. Acute cystitis without hematuria - CMP14+EGFR - CBC with  Differential/Platelet  4. Diarrhea, unspecified type - CMP14+EGFR - CBC with Differential/Platelet  5. Other fatigue - CMP14+EGFR - CBC with Differential/Platelet - Vitamin B12 - Thyroid Panel With TSH - Iron, TIBC and Ferritin Panel - Ambulatory referral to Sleep Studies - VITAMIN D 25 Hydroxy (Vit-D Deficiency, Fractures)  6. Daytime sleepiness - CMP14+EGFR - CBC with Differential/Platelet - Vitamin B12 - Thyroid Panel With TSH - Iron, TIBC and Ferritin Panel - Ambulatory referral to Sleep Studies  7. History of recurrent UTIs - nitrofurantoin, macrocrystal-monohydrate, (MACROBID) 100 MG capsule; Take one pill after sex  Dispense: 20 capsule; Refill: 11   Labs pending Health Maintenance reviewed Diet and exercise encouraged  Follow up plan: 6 months    Evelina Dun, FNP

## 2022-11-07 ENCOUNTER — Other Ambulatory Visit: Payer: Self-pay | Admitting: Family

## 2022-11-07 DIAGNOSIS — E559 Vitamin D deficiency, unspecified: Secondary | ICD-10-CM

## 2022-11-07 LAB — THYROID PANEL WITH TSH
Free Thyroxine Index: 1.6 (ref 1.2–4.9)
T3 Uptake Ratio: 30 % (ref 24–39)
T4, Total: 5.4 ug/dL (ref 4.5–12.0)
TSH: 1.78 u[IU]/mL (ref 0.450–4.500)

## 2022-11-07 LAB — CBC WITH DIFFERENTIAL/PLATELET
Basophils Absolute: 0 10*3/uL (ref 0.0–0.2)
Basos: 1 %
EOS (ABSOLUTE): 0.1 10*3/uL (ref 0.0–0.4)
Eos: 3 %
Hematocrit: 38.3 % (ref 34.0–46.6)
Hemoglobin: 13 g/dL (ref 11.1–15.9)
Immature Grans (Abs): 0 10*3/uL (ref 0.0–0.1)
Immature Granulocytes: 0 %
Lymphocytes Absolute: 1.4 10*3/uL (ref 0.7–3.1)
Lymphs: 32 %
MCH: 29.9 pg (ref 26.6–33.0)
MCHC: 33.9 g/dL (ref 31.5–35.7)
MCV: 88 fL (ref 79–97)
Monocytes Absolute: 0.4 10*3/uL (ref 0.1–0.9)
Monocytes: 9 %
Neutrophils Absolute: 2.4 10*3/uL (ref 1.4–7.0)
Neutrophils: 55 %
Platelets: 235 10*3/uL (ref 150–450)
RBC: 4.35 x10E6/uL (ref 3.77–5.28)
RDW: 12.4 % (ref 11.7–15.4)
WBC: 4.4 10*3/uL (ref 3.4–10.8)

## 2022-11-07 LAB — VITAMIN B12: Vitamin B-12: 392 pg/mL (ref 232–1245)

## 2022-11-07 LAB — CMP14+EGFR
ALT: 18 IU/L (ref 0–32)
AST: 19 IU/L (ref 0–40)
Albumin/Globulin Ratio: 1.9 (ref 1.2–2.2)
Albumin: 4.6 g/dL (ref 3.9–4.9)
Alkaline Phosphatase: 46 IU/L (ref 44–121)
BUN/Creatinine Ratio: 19 (ref 9–23)
BUN: 16 mg/dL (ref 6–20)
Bilirubin Total: 0.3 mg/dL (ref 0.0–1.2)
CO2: 24 mmol/L (ref 20–29)
Calcium: 9.4 mg/dL (ref 8.7–10.2)
Chloride: 105 mmol/L (ref 96–106)
Creatinine, Ser: 0.83 mg/dL (ref 0.57–1.00)
Globulin, Total: 2.4 g/dL (ref 1.5–4.5)
Glucose: 65 mg/dL — ABNORMAL LOW (ref 70–99)
Potassium: 4 mmol/L (ref 3.5–5.2)
Sodium: 143 mmol/L (ref 134–144)
Total Protein: 7 g/dL (ref 6.0–8.5)
eGFR: 93 mL/min/{1.73_m2} (ref >59)

## 2022-11-07 LAB — IRON,TIBC AND FERRITIN PANEL
Ferritin: 53 ng/mL (ref 15–150)
Iron Saturation: 30 % (ref 15–55)
Iron: 89 ug/dL (ref 27–159)
Total Iron Binding Capacity: 297 ug/dL (ref 250–450)
UIBC: 208 ug/dL (ref 131–425)

## 2022-11-07 LAB — VITAMIN D 25 HYDROXY (VIT D DEFICIENCY, FRACTURES): Vit D, 25-Hydroxy: 27.5 ng/mL — ABNORMAL LOW (ref 30.0–100.0)

## 2022-11-07 LAB — LIPID PANEL
Chol/HDL Ratio: 2.5 ratio (ref 0.0–4.4)
Cholesterol, Total: 147 mg/dL (ref 100–199)
HDL: 60 mg/dL (ref >39)
LDL Chol Calc (NIH): 73 mg/dL (ref 0–99)
Triglycerides: 70 mg/dL (ref 0–149)
VLDL Cholesterol Cal: 14 mg/dL (ref 5–40)

## 2022-11-07 MED ORDER — VITAMIN D (ERGOCALCIFEROL) 1.25 MG (50000 UNIT) PO CAPS: 50000.0000 [IU] | ORAL_CAPSULE | ORAL | 3 refills | Status: DC

## 2022-11-28 DIAGNOSIS — Z01419 Encounter for gynecological examination (general) (routine) without abnormal findings: Secondary | ICD-10-CM | POA: Diagnosis not present

## 2022-11-28 DIAGNOSIS — Z6821 Body mass index (BMI) 21.0-21.9, adult: Secondary | ICD-10-CM | POA: Diagnosis not present

## 2022-12-15 ENCOUNTER — Telehealth: Payer: Self-pay | Admitting: *Deleted

## 2022-12-15 DIAGNOSIS — M3500 Sicca syndrome, unspecified: Secondary | ICD-10-CM

## 2022-12-15 DIAGNOSIS — E162 Hypoglycemia, unspecified: Secondary | ICD-10-CM

## 2022-12-15 NOTE — Telephone Encounter (Signed)
Pt called in asking about her Vit D script. Informed her that her script was sent in for a 3 mos supply w/ a years refill and continue to take her prescription and have her level recheck at least once a year.  Pt also wanted to know about her BS level at her last visit. She saw her results on MyChart. Her glucose on 11/06/22 was low at 65. She was not fasting for her bloodwork, she had eaten a granola bar and had coffee w/ creamer & sugar about 8 am before her 910 am appt. She is concerned with this since she has been tired over the last year and says that she doesn't eat syrup on her pancakes anymore because it makes her shaky she has to use jelly. She knows her PCP is out of the office until 12/21/22

## 2022-12-19 ENCOUNTER — Encounter: Payer: Self-pay | Admitting: Neurology

## 2022-12-19 ENCOUNTER — Ambulatory Visit (INDEPENDENT_AMBULATORY_CARE_PROVIDER_SITE_OTHER): Payer: BC Managed Care – PPO | Admitting: Neurology

## 2022-12-19 VITALS — BP 121/79 | HR 81 | Ht 63.0 in | Wt 125.0 lb

## 2022-12-19 DIAGNOSIS — G4719 Other hypersomnia: Secondary | ICD-10-CM | POA: Diagnosis not present

## 2022-12-19 DIAGNOSIS — R400 Somnolence: Secondary | ICD-10-CM | POA: Diagnosis not present

## 2022-12-19 DIAGNOSIS — G4710 Hypersomnia, unspecified: Secondary | ICD-10-CM

## 2022-12-19 DIAGNOSIS — Z8659 Personal history of other mental and behavioral disorders: Secondary | ICD-10-CM | POA: Diagnosis not present

## 2022-12-19 NOTE — Progress Notes (Signed)
Subjective:    Patient ID: Kynli Chou is a 37 y.o. female.  HPI    Star Age, MD, PhD [facility] [address] [place], [place] [address]  Dear Alyse Low,  I saw your patient, Reannon Candella, upon your kind request in my sleep clinic today for initial consultation of her sleep disorder, in particular, her excessive daytime somnolence.  The patient is unaccompanied today.  As you know, Ms. Lykins is a 37 year old female with an underlying medical history of recurrent UTI, Sjogren syndrome, and vitamin D deficiency, who reports a several year history of excessive daytime somnolence.  Over the past 3 years her symptoms have become worse.  She tries to sleep at least 8 to 9 hours on any given night and wakes up fairly well rested just to become sleepy again later in the morning.  Whenever she is sedentary, she will fall asleep.  She has been working as a Biomedical scientist.  She was diagnosed with Sjogren's syndrome some 3 years ago.  She also had significant hospitalization with COVID some 3 years ago.  She lives at home with her husband and 2 children.  She works full-time.  She is a non-smoker and drinks alcohol very occasionally.  She drinks caffeine in limitation, generally only 1 cup of coffee in the morning and occasionally a serving of tea or soda. For the past 1.5 years or so she has struggled with nocturnal diarrhea and GI issues.  She was able to improve her nausea and nocturnal diarrhea with dietary changes especially at night.  She has not seen GI.  She had a sporadic reoccurrence of nocturnal diarrhea some few weeks ago.  She has no family history of sleep apnea or narcolepsy, she has a history of sleepwalking as a child and has had nighttime illusions or even hallucinations, no telltale cataplexy, no sleep paralysis.  No vivid dreams, in fact, rarely remembers dreams.   Her Epworth sleepiness score is 15 out of 24, fatigue severity score is 47  out of 63.  She denies night to night nocturia or snoring or recurrent morning headaches.  Bedtime is generally around 9 PM and rise time around 6 AM. I reviewed your office note from 11/06/2022. Some 6 years ago she had breast implants and she recently talked to her GYN about the possibility of side effects from breast implants.  She is planning a consultation with a Psychologist, sport and exercise.  Her Past Medical History Is Significant For: Past Medical History:  Diagnosis Date   No pertinent past medical history    PP care - s/p C/S 12/26 12/13/2011   Preterm contractions 11/19/2011   Preterm labor    12/2009 34wks   Sjogren's disease ([place])    Vitamin D deficiency     Her Past Surgical History Is Significant For: Past Surgical History:  Procedure Laterality Date   CESAREAN SECTION     2011   CESAREAN SECTION  12/13/2011   Procedure: CESAREAN SECTION;  Surgeon: Lovenia Kim, MD;  Location: [facility];  Service: Gynecology;  Laterality: N/A;  repeat cesarean section    Her Family History Is Significant For: Family History  Problem Relation Age of Onset   High Cholesterol Mother    Endometriosis Sister    Kidney disease Maternal Grandmother    Heart attack Maternal Grandmother    Juvenile Rhematoid Arthritis Paternal Grandfather     Her Social History Is Significant For: Social History   Socioeconomic History  Marital status: Married    Spouse name: Not on file   Number of children: Not on file   Years of education: Not on file   Highest education level: Not on file  Occupational History   Not on file  Tobacco Use   Smoking status: Never   Smokeless tobacco: Never  Vaping Use   Vaping Use: Never used  Substance and Sexual Activity   Alcohol use: No   Drug use: No   Sexual activity: Yes  Other Topics Concern   Not on file  Social History Narrative   Caffiene 1 coffee dailym one can soda daily..  (2 drinkss aily)   Married 2 kids   Works [employer] school in [place] Strain: Not on [employer] Insecurity: Not on file  Transportation Needs: Not on file  Physical Activity: Not on file  Stress: Not on file  Social Connections: Not on file    Her Allergies Are:  No Known Allergies:   Her Current Medications Are:  Outpatient Encounter Medications as of 12/19/2022  Medication Sig   nitrofurantoin, macrocrystal-monohydrate, (MACROBID) 100 MG capsule Take one pill after sex   Vitamin D, Ergocalciferol, (DRISDOL) 1.25 MG (50000 UNIT) CAPS capsule Take 1 capsule (50,000 Units total) by mouth every 7 (seven) days.   No facility-administered encounter medications on file as of 12/19/2022.  :   Review of Systems:  Out of a complete 14 point review of systems, all are reviewed and negative with the exception of these symptoms as listed below:   Review of Systems  Neurological:        A lot daytime fatigue.  (For the last 2 yrs).  ESS 15, FSS 47.  NO SS.  She has vit d def and takes supplement weekly.     Objective:  Neurological Exam  Physical Exam Physical Examination:   Vitals:   12/19/22 1254  BP: 121/79  Pulse: 81    General Examination: The patient is a very pleasant 37 y.o. female in no acute distress. She appears well-developed and well-nourished and well groomed.   HEENT: Normocephalic, atraumatic, pupils are equal, round and reactive to light, extraocular tracking is good without limitation to gaze excursion or nystagmus noted. Hearing is grossly intact. Face is symmetric with normal facial animation. Speech is clear with no dysarthria noted. There is no hypophonia. There is no lip, neck/head, jaw or voice tremor. Neck is supple with full range of passive and active motion. There are no carotid bruits on auscultation. Oropharynx exam reveals: mild mouth dryness, good dental hygiene and mild airway crowding, due to small airway entry, tonsillar size of about 1+, wider tongue.  She has a  minimal overbite.  Neck circumference 13.8 inches. Mallampati is class II. Tongue protrudes centrally and palate elevates symmetrically.   Chest: Clear to auscultation without wheezing, rhonchi or crackles noted.  Heart: S1+S2+0, regular and normal without murmurs, rubs or gallops noted.   Abdomen: Soft, non-tender and non-distended.  Extremities: There is no pitting edema in the distal lower extremities bilaterally.   Skin: Warm and dry without trophic changes noted.   Musculoskeletal: exam reveals no obvious joint deformities.   Neurologically:  Mental status: The patient is awake, alert and oriented in all 4 spheres. Her immediate and remote memory, attention, language skills and fund of knowledge are appropriate. There is no evidence of aphasia, agnosia, apraxia or anomia. Speech is clear with normal  prosody and enunciation. Thought process is linear. Mood is normal and affect is normal.  Cranial nerves II - XII are as described above under HEENT exam.  Motor exam: Normal bulk, strength and tone is noted. There is no obvious action or resting tremor.  Fine motor skills and coordination: grossly intact.  Cerebellar testing: No dysmetria or intention tremor. There is no truncal or gait ataxia.  Sensory exam: intact to light touch in the upper and lower extremities.  Gait, station and balance: She stands easily. No veering to one side is noted. No leaning to one side is noted. Posture is age-appropriate and stance is narrow based. Gait shows normal stride length and normal pace. No problems turning are noted.   Assessment and Plan:  In summary, Ms. Kimbell is a 37 year old female with an underlying medical history of recurrent UTI, Sjogren syndrome, and vitamin D deficiency, who presents for initial consultation of her significant daytime somnolence of several years duration, worse over the past 2 or 3 years. Her history and exam are not telltale for underlying obstructive sleep apnea, she may  have an underlying hypersomnolence disorder, with narcolepsy without cataplexy or idiopathic hypersomnolence in the differential diagnoses and/or hypersomnolence with sleep apnea, also being a possibility. The patient is advised to proceed with extended sleep study testing in the form of nocturnal polysomnogram, followed by a next day nap study, called MSLT (mean sleep latency test). I explained the sleep study procedures to the patient. In preparation for sleep study testing she does not have to taper any stimulant medication or psychotropic medications at this time. She is advised to keep a set schedule for her sleep in preparation for sleep study testing as well as limit and stabilize caffeine intake, which is limited at this time. I explained to the patient that should the overnight PSG reveal obstructive sleep apnea we will forego the next day nap study, as we will have to treat the underlying sleep disordered breathing first. I will plan a follow-up after testing.  We may consider symptomatic treatments for an underlying hypersomnolence disorder. I answered all her questions today was is in agreement with the plan.   Thank you very much for allowing me to participate in the care of this nice patient. If I can be of any further assistance to you please do not hesitate to call me at [phone].  Sincerely,   Huston Foley, MD, PhD

## 2022-12-19 NOTE — Patient Instructions (Signed)
Thank you for choosing [facility] for your sleep related care! It was nice to meet you today! I appreciate that you entrust me with your sleep related healthcare concerns. I hope, I was able to address at least some of your concerns today, and that I can help you feel reassured and also get better.    Here is what we discussed today and what we came up with as our plan for you:    Based on your symptoms and your exam I believe you may have an underlying sleepiness condition. We can look into your sleepiness and tiredness, which you had for years with a nighttime sleep study, followed by a daytime nap study. In preparation for sleep study testing, please keep your sleep habits stable and do no add any new medications, such as antidepressant medications and/or stimulants.   The overnight sleep study will help determine whether you do or do not have OSA and how severe it is. Even, if you have mild OSA, I may want you to consider treatment with CPAP, as treatment of even borderline or mild sleep apnea can result and improvement of symptoms such as sleep disruption, daytime sleepiness, nighttime bathroom breaks, restless leg symptoms, improvement of headache syndromes, even improved mood disorder.   Please remember, the long-term risks and ramifications of untreated moderate to severe obstructive sleep apnea are: increased Cardiovascular disease, including congestive heart failure, stroke, difficult to control hypertension, treatment resistant obesity, arrhythmias, especially irregular heartbeat commonly known as A. Fib. (atrial fibrillation); even type 2 diabetes has been linked to untreated OSA.   Sleep apnea can cause disruption of sleep and sleep deprivation in most cases, which, in turn, can cause recurrent headaches, problems with memory, mood, concentration, focus, and vigilance. Most people with untreated sleep apnea report excessive daytime sleepiness, which can affect their  ability to drive. Please do not drive if you feel sleepy. Patients with sleep apnea can also develop difficulty initiating and maintaining sleep (aka insomnia).   Having sleep apnea may increase your risk for other sleep disorders, including involuntary behaviors sleep such as sleep terrors, sleep talking, sleepwalking.    Having sleep apnea can also increase your risk for restless leg syndrome and leg movements at night.   Please note that untreated obstructive sleep apnea may carry additional perioperative morbidity. Patients with significant obstructive sleep apnea (typically, in the moderate to severe degree) should receive, if possible, perioperative PAP (positive airway pressure) therapy and the surgeons and particularly the anesthesiologists should be informed of the diagnosis and the severity of the sleep disordered breathing.   Please do not drive when feeling sleepy.   I will plan to see you back after your sleep study to go over the test results and where to go from there. We will call you after your sleep study to advise about the results (most likely, you will hear from my nurse) and to set up an appointment at the time, as necessary.

## 2022-12-21 NOTE — Addendum Note (Signed)
Addended by: Evelina Dun A on: 12/21/2022 01:25 PM   Modules accepted: Orders

## 2022-12-21 NOTE — Telephone Encounter (Signed)
I will order another CMP and come in get labs drawn fasting.   Evelina Dun, FNP

## 2023-02-12 ENCOUNTER — Telehealth: Payer: Self-pay | Admitting: Neurology

## 2023-02-12 DIAGNOSIS — G4710 Hypersomnia, unspecified: Secondary | ICD-10-CM

## 2023-02-12 DIAGNOSIS — G4719 Other hypersomnia: Secondary | ICD-10-CM

## 2023-02-12 DIAGNOSIS — Z8659 Personal history of other mental and behavioral disorders: Secondary | ICD-10-CM

## 2023-02-12 DIAGNOSIS — R400 Somnolence: Secondary | ICD-10-CM

## 2023-02-12 NOTE — Telephone Encounter (Signed)
A case was started on 01/31/23.  I called to check the status, it is still pending.

## 2023-02-27 ENCOUNTER — Telehealth: Payer: Self-pay | Admitting: Neurology

## 2023-02-27 NOTE — Telephone Encounter (Signed)
Thanks, merged with other phone note.

## 2023-02-27 NOTE — Telephone Encounter (Signed)
Veronica Hayes denied the NPSG/MSLT stating it is not medically necessity.   Do you want to order a HST- the patient can have the study and if it showed something I would be able to submit the results for a HST and try again for approval for NPSG/MSLT.

## 2023-02-27 NOTE — Telephone Encounter (Signed)
HST ordered, so long as patient ok to proceed with it. Please check with patient first, HST will evaluate for sleep apnea only.

## 2023-02-27 NOTE — Telephone Encounter (Signed)
Called pt & LVM (Ok per [employer]) advising her that insurance won't approve in-lab SS and  MSLT but they will approve the HST first. HST will only rule out sleep apnea but we can see what that shows and then may be able to submit to insurance again for approval for other SS. Asked pt for call back to let us know if she's ok with this plan. HST has been ordered.

## 2023-02-27 NOTE — Telephone Encounter (Signed)
Pt called back and stated that she will be ok doing an HST. Please advise.

## 2023-02-28 NOTE — Telephone Encounter (Signed)
Noted, thank you- HST pending with [employer] faxed notes.

## 2023-03-05 ENCOUNTER — Encounter: Payer: Self-pay | Admitting: Family Medicine

## 2023-03-05 ENCOUNTER — Ambulatory Visit (INDEPENDENT_AMBULATORY_CARE_PROVIDER_SITE_OTHER): Payer: BC Managed Care – PPO | Admitting: Family Medicine

## 2023-03-05 VITALS — BP 102/61 | HR 64 | Temp 98.3°F | Ht 63.0 in | Wt 127.2 lb

## 2023-03-05 DIAGNOSIS — R52 Pain, unspecified: Secondary | ICD-10-CM | POA: Diagnosis not present

## 2023-03-05 DIAGNOSIS — J029 Acute pharyngitis, unspecified: Secondary | ICD-10-CM | POA: Diagnosis not present

## 2023-03-05 LAB — RAPID STREP SCREEN (MED CTR MEBANE ONLY): Strep Gp A Ag, IA W/Reflex: POSITIVE — AB

## 2023-03-05 LAB — VERITOR FLU A/B WAIVED
Influenza A: NEGATIVE
Influenza B: NEGATIVE

## 2023-03-05 MED ORDER — AMOXICILLIN 500 MG PO CAPS: 500.0000 mg | ORAL_CAPSULE | Freq: Two times a day (BID) | ORAL | 0 refills | Status: AC

## 2023-03-05 NOTE — Patient Instructions (Signed)

## 2023-03-05 NOTE — Progress Notes (Signed)
Acute Office Visit  Subjective:     Patient ID: Veronica Hayes, female    DOB: 17-Nov-1986, 37 y.o.   MRN: [id number]  Chief Complaint  Patient presents with   Sore Throat    Sore Throat  This is a new problem. The current episode started yesterday. The problem has been unchanged. There has been no fever. Associated symptoms include swollen glands. Pertinent negatives include no abdominal pain, congestion, coughing, diarrhea, drooling, ear discharge, ear pain, headaches, hoarse voice, plugged ear sensation, neck pain, shortness of breath, trouble swallowing or vomiting. Associated symptoms comments: myalgias. She has tried acetaminophen (tylenol cold and flu) for the symptoms. The treatment provided no relief.   She works at [employer]. She would like to be tested for the flu. Denies known exposure to strep.   Review of Systems  HENT:  Negative for congestion, drooling, ear discharge, ear pain, hoarse voice and trouble swallowing.   Respiratory:  Negative for cough and shortness of breath.   Gastrointestinal:  Negative for abdominal pain, diarrhea and vomiting.  Musculoskeletal:  Negative for neck pain.  Neurological:  Negative for headaches.        Objective:    BP 102/61   Pulse 64   Temp 98.3 F (36.8 C) (Temporal)   Ht 5\' 3"  (1.6 m)   Wt 127 lb 4 oz (57.7 kg)   SpO2 97%   BMI 22.54 kg/m    Physical Exam Vitals and nursing note reviewed.  Constitutional:      General: She is not in acute distress.    Appearance: She is not ill-appearing, toxic-appearing or diaphoretic.  HENT:     Head: Normocephalic and atraumatic.     Right Ear: Tympanic membrane, ear canal and external ear normal.     Left Ear: Tympanic membrane, ear canal and external ear normal.     Nose: Nose normal.     Mouth/Throat:     Mouth: Mucous membranes are moist.     Pharynx: Uvula midline. Posterior oropharyngeal erythema present. No oropharyngeal exudate or uvula swelling.     Tonsils: No  tonsillar exudate or tonsillar abscesses. 1+ on the right. 1+ on the left.  Eyes:     General:        Right eye: No discharge.        Left eye: No discharge.     Conjunctiva/sclera: Conjunctivae normal.  Cardiovascular:     Rate and Rhythm: Normal rate and regular rhythm.     Heart sounds: Normal heart sounds. No murmur heard. Pulmonary:     Effort: Pulmonary effort is normal. No respiratory distress.     Breath sounds: Normal breath sounds.  Musculoskeletal:     Cervical back: Neck supple. No rigidity.     Right lower leg: No edema.     Left lower leg: No edema.  Lymphadenopathy:     Cervical: Cervical adenopathy present.  Skin:    General: Skin is warm and dry.  Neurological:     General: No focal deficit present.     Mental Status: She is alert and oriented to person, place, and time.  Psychiatric:        Mood and Affect: Mood normal.        Behavior: Behavior normal.     No results found for any visits on 03/05/23.      Assessment & Plan:   Veronica Hayes was seen today for sore throat.  Diagnoses and all orders for this visit:  Sore  throat + Strep. Negative flu. Amoxicillin as below. Discussed symptomatic care and return precautions.  -     Veritor Flu A/B Waived -     Rapid Strep Screen ([facility] ONLY) -     amoxicillin (AMOXIL) 500 MG capsule; Take 1 capsule (500 mg total) by mouth 2 (two) times daily for 10 days.  Return if symptoms worsen or fail to improve.  Veronica Perking, FNP

## 2023-03-08 NOTE — Telephone Encounter (Signed)
Spoke with the patient she is okay to do a mail out [place] no auth req via fax form- pt will be on vacation from March 31- April 6th & will do it after then   She is on the schedule for 03/13/23.

## 2023-03-12 ENCOUNTER — Telehealth: Payer: Self-pay | Admitting: Family Medicine

## 2023-03-12 DIAGNOSIS — B379 Candidiasis, unspecified: Secondary | ICD-10-CM

## 2023-03-12 MED ORDER — FLUCONAZOLE 150 MG PO TABS: ORAL_TABLET | ORAL | 0 refills | Status: DC

## 2023-03-12 NOTE — Telephone Encounter (Signed)
Pt aware rx sent into pharmacy. 

## 2023-03-12 NOTE — Telephone Encounter (Signed)
Patient called to let Tiffany know that she has taken the antibiotic as directed but says the antibiotic has caused her to get a yeast infection and needs medicine sent to [facility] in [place] for the yeast infection.   Please advise and call patient.

## 2023-03-12 NOTE — Telephone Encounter (Signed)
D[facility]lucan sent to [facility]

## 2023-03-13 ENCOUNTER — Ambulatory Visit: Payer: BC Managed Care – PPO | Admitting: Neurology

## 2023-03-13 DIAGNOSIS — G4710 Hypersomnia, unspecified: Secondary | ICD-10-CM

## 2023-03-13 DIAGNOSIS — R0683 Snoring: Secondary | ICD-10-CM

## 2023-03-13 DIAGNOSIS — R400 Somnolence: Secondary | ICD-10-CM

## 2023-03-13 DIAGNOSIS — Z8659 Personal history of other mental and behavioral disorders: Secondary | ICD-10-CM

## 2023-03-13 DIAGNOSIS — G4719 Other hypersomnia: Secondary | ICD-10-CM

## 2023-04-04 NOTE — Progress Notes (Signed)
See procedure note.

## 2023-04-06 NOTE — Procedures (Signed)
   [facility]  HOME SLEEP TEST (Watch PAT) REPORT - Mail-out Device  STUDY DATE: 03/28/23  DOB: 03/06/1986  MRN: [id number]  ORDERING CLINICIAN: Huston Foley, MD, PhD   REFERRING CLINICIAN: Junie Spencer, FNP   CLINICAL INFORMATION/HISTORY: 37 year old female with an underlying medical history of recurrent UTI, Sjogren's syndrome, and vitamin D deficiency, who reports a several year history of excessive daytime somnolence.   Epworth sleepiness score: 15/24.  BMI: 22.3 kg/m  FINDINGS:   Sleep Summary:   Total Recording Time (hours, min): 7 hours, 57 min  Total Sleep Time (hours, min):  7 hours, 8 min  Percent REM (%):    25%   Respiratory Indices:   Calculated pAHI (per hour):  0.7/hour         REM pAHI:    2.3/hour       NREM pAHI: 0.2/hour  Central pAHI: 0/hour  Oxygen Saturation Statistics:    Oxygen Saturation (%) Mean: 95%   Minimum oxygen saturation (%):                 92%   O2 Saturation Range (%): 92 - 98%    O2 Saturation (minutes) <=88%: 0 min  Pulse Rate Statistics:   Pulse Mean (bpm):    63/min    Pulse Range (43 - 97/min)   IMPRESSION: Primary snoring  RECOMMENDATION:  This home sleep test does not demonstrate any significant obstructive or central sleep disordered breathing with a total AHI of less than 5/hour.  Her AHI was 0.7/h, O2 nadir 92%.  Intermittent mild snoring was detected, rarely in the moderate range. Treatment with a positive airway pressure device such as AutoPap or CPAP is not indicated based on this test. Snoring may improve with avoidance of the supine sleep position.   Other causes of the patient's symptoms, including circadian rhythm disturbances, an underlying mood disorder, medication effect and/or an underlying medical problem cannot be ruled out based on this test. Clinical correlation is recommended.  Since the patient has a several year history of excessive daytime somnolence, further evaluation  with in lab sleep testing and next a nap testing should be considered. The patient should be cautioned not to drive, work at heights, or operate dangerous or heavy equipment when tired or sleepy. Review and reiteration of good sleep hygiene measures should be pursued with any patient. The patient will be advised to follow up with her referring provider, who will be notified of the test results.   I certify that I have reviewed the raw data recording prior to the issuance of this report in accordance with the standards of the American Academy of Sleep Medicine (AASM).    INTERPRETING PHYSICIAN:   Huston Foley, MD, PhD Medical Director, [facility]) Diplomat, ABPN (Neurology and Sleep)   [facility] [address], [address] [place], [place] [address] [phone]

## 2023-04-11 ENCOUNTER — Telehealth: Payer: Self-pay | Admitting: *Deleted

## 2023-04-11 NOTE — Telephone Encounter (Signed)
-----   Message from Huston Foley, MD sent at 04/06/2023 11:21 AM EDT ----- Patient had a recent home sleep test on 03/28/2023.  It was negative for sleep apnea.  Her insurance had previously denied a PSG/MSLT.  Patient has a history of significant daytime somnolence for years.  If she would like to proceed with a laboratory attended sleep study and next a nap testing, we may be able to resubmit at this point to her insurance the laboratory testing.  If she is agreeable, please forward phone note to [place] and [place].  PSG order and MSLT orders are on file still.

## 2023-04-11 NOTE — Telephone Encounter (Addendum)
I spoke with the patient. She verbalized understanding that her home sleep test was negative for sleep apnea.  At this time she is unsure if she wants to proceed with a sleep study.  She states that since she started taking replacement vitamin D, she has noticed some improvement in her sleepiness during the day and improved ability to get out of bed and start her day when the alarm goes off.  She states that she recently looked at her blood work from a few months ago and her blood glucose was at the very low end of normal even though she had just eaten and drank sugary foods right before having the labwork.  She asked her primary care and they are going to repeat the lab work when she is fasting. She states she finds herself very sleepy after meals. She plans to have labs done on May 2 or 3 and then she will be able to see if there is possibly an issue with her blood sugar regulation. I asked her to keep [place] informed. I will let Dr Frances Furbish know and if she has any comments or recommendations, will relay to patient via mychart. She verbalized appreciation for the call.

## 2023-04-11 NOTE — Telephone Encounter (Signed)
Noted, sounds good. Thank you.

## 2024-03-04 DIAGNOSIS — Z01419 Encounter for gynecological examination (general) (routine) without abnormal findings: Secondary | ICD-10-CM | POA: Diagnosis not present

## 2024-03-04 DIAGNOSIS — Z30432 Encounter for removal of intrauterine contraceptive device: Secondary | ICD-10-CM | POA: Diagnosis not present

## 2024-03-04 DIAGNOSIS — Z124 Encounter for screening for malignant neoplasm of cervix: Secondary | ICD-10-CM | POA: Diagnosis not present

## 2024-03-04 DIAGNOSIS — Z1331 Encounter for screening for depression: Secondary | ICD-10-CM | POA: Diagnosis not present

## 2024-07-12 ENCOUNTER — Emergency Department (HOSPITAL_BASED_OUTPATIENT_CLINIC_OR_DEPARTMENT_OTHER)

## 2024-07-12 ENCOUNTER — Other Ambulatory Visit: Payer: Self-pay

## 2024-07-12 ENCOUNTER — Emergency Department (HOSPITAL_BASED_OUTPATIENT_CLINIC_OR_DEPARTMENT_OTHER)
Admission: EM | Admit: 2024-07-12 | Discharge: 2024-07-12 | Disposition: A | Discharge status: Home / Self Care | Attending: Emergency Medicine | Admitting: Emergency Medicine

## 2024-07-12 DIAGNOSIS — N132 Hydronephrosis with renal and ureteral calculous obstruction: Secondary | ICD-10-CM | POA: Diagnosis not present

## 2024-07-12 DIAGNOSIS — R109 Unspecified abdominal pain: Secondary | ICD-10-CM | POA: Diagnosis not present

## 2024-07-12 DIAGNOSIS — N200 Calculus of kidney: Secondary | ICD-10-CM | POA: Diagnosis not present

## 2024-07-12 DIAGNOSIS — N2882 Megaloureter: Secondary | ICD-10-CM | POA: Diagnosis not present

## 2024-07-12 DIAGNOSIS — N201 Calculus of ureter: Secondary | ICD-10-CM | POA: Diagnosis not present

## 2024-07-12 DIAGNOSIS — R102 Pelvic and perineal pain: Secondary | ICD-10-CM | POA: Diagnosis present

## 2024-07-12 LAB — CBC WITH DIFFERENTIAL/PLATELET
Abs Immature Granulocytes: 0.06 K/uL (ref 0.00–0.07)
Basophils Absolute: 0 K/uL (ref 0.0–0.1)
Basophils Relative: 0 %
Eosinophils Absolute: 0.1 K/uL (ref 0.0–0.5)
Eosinophils Relative: 1 %
HCT: 37.9 % (ref 36.0–46.0)
Hemoglobin: 12.9 g/dL (ref 12.0–15.0)
Immature Granulocytes: 1 %
Lymphocytes Relative: 13 %
Lymphs Abs: 1.6 K/uL (ref 0.7–4.0)
MCH: 29.7 pg (ref 26.0–34.0)
MCHC: 34 g/dL (ref 30.0–36.0)
MCV: 87.1 fL (ref 80.0–100.0)
Monocytes Absolute: 0.6 K/uL (ref 0.1–1.0)
Monocytes Relative: 5 %
Neutro Abs: 9.3 K/uL — ABNORMAL HIGH (ref 1.7–7.7)
Neutrophils Relative %: 80 %
Platelets: 268 K/uL (ref 150–400)
RBC: 4.35 MIL/uL (ref 3.87–5.11)
RDW: 11.9 % (ref 11.5–15.5)
WBC: 11.6 K/uL — ABNORMAL HIGH (ref 4.0–10.5)
nRBC: 0 % (ref 0.0–0.2)

## 2024-07-12 LAB — URINALYSIS, ROUTINE W REFLEX MICROSCOPIC
Bacteria, UA: NONE SEEN
Bilirubin Urine: NEGATIVE
Glucose, UA: NEGATIVE mg/dL
Ketones, ur: NEGATIVE mg/dL
Leukocytes,Ua: NEGATIVE
Nitrite: NEGATIVE
Protein, ur: NEGATIVE mg/dL
RBC / HPF: >50 RBC/hpf (ref 0–5)
Specific Gravity, Urine: 1.018 (ref 1.005–1.030)
pH: 6 (ref 5.0–8.0)

## 2024-07-12 LAB — BASIC METABOLIC PANEL WITH GFR
Anion gap: 12 (ref 5–15)
BUN: 13 mg/dL (ref 6–20)
CO2: 23 mmol/L (ref 22–32)
Calcium: 9.5 mg/dL (ref 8.9–10.3)
Chloride: 103 mmol/L (ref 98–111)
Creatinine, Ser: 0.82 mg/dL (ref 0.44–1.00)
GFR, Estimated: >60 mL/min (ref >60)
Glucose, Bld: 102 mg/dL — ABNORMAL HIGH (ref 70–99)
Potassium: 4 mmol/L (ref 3.5–5.1)
Sodium: 138 mmol/L (ref 135–145)

## 2024-07-12 LAB — HCG, SERUM, QUALITATIVE: Preg, Serum: NEGATIVE

## 2024-07-12 MED ORDER — HYDROCODONE-ACETAMINOPHEN 5-325 MG PO TABS: 1.0000 | ORAL_TABLET | ORAL | 0 refills | Status: DC | PRN

## 2024-07-12 MED ORDER — KETOROLAC TROMETHAMINE 15 MG/ML IJ SOLN
15.0000 mg | Freq: Once | INTRAMUSCULAR | Status: AC
  Administered 2024-07-12: 15 mg via INTRAVENOUS
  Filled 2024-07-12: qty 1

## 2024-07-12 MED ORDER — TAMSULOSIN HCL 0.4 MG PO CAPS: 0.4000 mg | ORAL_CAPSULE | Freq: Every day | ORAL | 0 refills | Status: DC

## 2024-07-12 MED ORDER — ONDANSETRON 4 MG PO TBDP
ORAL_TABLET | ORAL | 0 refills | Status: DC
Start: 2024-07-12 — End: 2024-09-04

## 2024-07-12 NOTE — ED Notes (Signed)
 Dc instructions given pt verbalized understanding. PIV removed. Out of ED with all belongings and paperwork with spouse not in visible distress. Rx sent to pt pharmacy.

## 2024-07-12 NOTE — Discharge Instructions (Addendum)
 Make an appointment to follow-up with the urologist.  Return to the emergency room if you have any worsening pain, fevers, ongoing vomiting, difficulty urinating or other worsening symptoms

## 2024-07-12 NOTE — ED Provider Notes (Signed)
 [facility] EMERGENCY DEPARTMENT AT [facility] Provider Note   CSN: [id number] Arrival date & time: 07/12/24  1857     Patient presents with: Pelvic Pain   Veronica Hayes is a 38 y.o. female.   Patient is a 38 year old female who presents with right-sided flank pain.  She said it started earlier today in her back.  It is now come around to her lower right abdomen.  It was more intense earlier but now has eased off a bit.  She has had a little nausea but no vomiting.  No urinary symptoms.  No history of similar pain in the past.  No fevers.  No change in her stools.       Prior to Admission medications   Medication Sig Start Date End Date Taking? Authorizing Provider  HYDROcodone -acetaminophen  (NORCO/VICODIN) 5-325 MG tablet Take 1-2 tablets by mouth every 4 (four) hours as needed. 07/12/24  Yes Lenor Hollering, MD  ondansetron  (ZOFRAN -ODT) 4 MG disintegrating tablet 4mg  ODT q4 hours prn nausea/vomit 07/12/24  Yes Lenor Hollering, MD  tamsulosin  (FLOMAX ) 0.4 MG CAPS capsule Take 1 capsule (0.4 mg total) by mouth daily. 07/12/24  Yes Lenor Hollering, MD  fluconazole  (DIFLUCAN ) 150 MG tablet Take one tablet by mouth now. May repeat in 3 days if symptoms persist. 03/12/23   Joesph Annabella HERO, FNP  nitrofurantoin , macrocrystal-monohydrate, (MACROBID ) 100 MG capsule Take one pill after sex 11/06/22   Lavell Lye A, FNP  Vitamin D , Ergocalciferol , (DRISDOL ) 1.25 MG (50000 UNIT) CAPS capsule Take 1 capsule (50,000 Units total) by mouth every 7 (seven) days. 11/07/22   Lavell Lye LABOR, FNP    Allergies: Patient has no known allergies.    Review of Systems  Constitutional:  Negative for chills, diaphoresis, fatigue and fever.  HENT:  Negative for congestion, rhinorrhea and sneezing.   Eyes: Negative.   Respiratory:  Negative for cough, chest tightness and shortness of breath.   Cardiovascular:  Negative for chest pain and leg swelling.  Gastrointestinal:  Positive for abdominal pain  and nausea. Negative for blood in stool, diarrhea and vomiting.  Genitourinary:  Positive for flank pain. Negative for difficulty urinating, frequency and hematuria.  Musculoskeletal:  Negative for arthralgias and back pain.  Skin:  Negative for rash.  Neurological:  Negative for dizziness, speech difficulty, weakness, numbness and headaches.    Updated Vital Signs BP 138/79   Pulse 69   Temp 98.3 F (36.8 C) (Oral)   Resp 16   Ht 5' 3 (1.6 m)   Wt 59 kg   SpO2 100%   BMI 23.03 kg/m   Physical Exam Constitutional:      Appearance: She is well-developed.  HENT:     Head: Normocephalic and atraumatic.  Eyes:     Pupils: Pupils are equal, round, and reactive to light.  Cardiovascular:     Rate and Rhythm: Normal rate and regular rhythm.     Heart sounds: Normal heart sounds.  Pulmonary:     Effort: Pulmonary effort is normal. No respiratory distress.     Breath sounds: Normal breath sounds. No wheezing or rales.  Chest:     Chest wall: No tenderness.  Abdominal:     General: Bowel sounds are normal.     Palpations: Abdomen is soft.     Tenderness: There is abdominal tenderness (Mild tenderness to the right lower abdomen). There is no guarding or rebound.  Musculoskeletal:        General: Normal range of motion.  Cervical back: Normal range of motion and neck supple.  Lymphadenopathy:     Cervical: No cervical adenopathy.  Skin:    General: Skin is warm and dry.     Findings: No rash.  Neurological:     Mental Status: She is alert and oriented to person, place, and time.     (all labs ordered are listed, but only abnormal results are displayed) Labs Reviewed  URINALYSIS, ROUTINE W REFLEX MICROSCOPIC - Abnormal; Notable for the following components:      Result Value   APPearance HAZY (*)    Hgb urine dipstick LARGE (*)    All other components within normal limits  BASIC METABOLIC PANEL WITH GFR - Abnormal; Notable for the following components:   Glucose, Bld  102 (*)    All other components within normal limits  CBC WITH DIFFERENTIAL/PLATELET - Abnormal; Notable for the following components:   WBC 11.6 (*)    Neutro Abs 9.3 (*)    All other components within normal limits  HCG, SERUM, QUALITATIVE    EKG: None  Radiology: CT Renal Stone Study Result Date: 07/12/2024 CLINICAL DATA:  Right-sided flank pain for 1 day EXAM: CT ABDOMEN AND PELVIS WITHOUT CONTRAST TECHNIQUE: Multidetector CT imaging of the abdomen and pelvis was performed following the standard protocol without IV contrast. RADIATION DOSE REDUCTION: This exam was performed according to the departmental dose-optimization program which includes automated exposure control, adjustment of the mA and/or kV according to patient size and/or use of iterative reconstruction technique. COMPARISON:  None Available. FINDINGS: Lower chest: No acute abnormality. Hepatobiliary: No focal liver abnormality is seen. No gallstones, gallbladder wall thickening, or biliary dilatation. Pancreas: Unremarkable. No pancreatic ductal dilatation or surrounding inflammatory changes. Spleen: Normal in size without focal abnormality. Adrenals/Urinary Tract: Adrenal glands are within normal limits. Left kidney is unremarkable. Right kidney demonstrates mild enlargement as well as fullness of the collecting system. No renal calculi are seen. The ureter is dilated to the level of the ureterovesical junction. Just above the ureterovesical junction, there is a 4 mm stone identified causing obstructive change. Bladder is decompressed. Stomach/Bowel: No obstructive or inflammatory changes of the colon are noted. Appendix is not well visualized. No inflammatory changes to suggest appendicitis are noted. Small bowel and stomach are within normal limits. Vascular/Lymphatic: No significant vascular findings are present. No enlarged abdominal or pelvic lymph nodes. Reproductive: Uterus and bilateral adnexa are unremarkable. Other: No  abdominal wall hernia or abnormality. No abdominopelvic ascites. Musculoskeletal: No acute or significant osseous findings. IMPRESSION: 4 mm distal right ureteral stone with obstructive change No other focal abnormality is noted. Electronically Signed   By: Oneil Devonshire M.D.   On: 07/12/2024 22:39     Procedures   Medications Ordered in the ED  ketorolac  (TORADOL ) 15 MG/ML injection 15 mg (15 mg Intravenous Given 07/12/24 2302)                                    Medical Decision Making Amount and/or Complexity of Data Reviewed Labs: ordered. Radiology: ordered.  Risk Prescription drug management.   This patient presents to the ED for concern of flank pain, this involves an extensive number of treatment options, and is a complaint that carries with it a high risk of complications and morbidity.  I considered the following differential and admission for this acute, potentially life threatening condition.  The differential diagnosis includes kidney stone,  UTI, pyelonephritis, cholecystitis, ovarian torsion, ectopic pregnancy  MDM:    Patient is a 38 year old who presents with some right flank pain.  Started her back and now is in her right lower abdomen.  She is fairly comfortable currently and did not require any pain medication initially.  Labs show no hematuria.  She has normal creatinine.  Pregnancy test is negative.  CT scan shows 4 mm stone in the distal right ureter.  No suggestions of UTI.  She was given a dose of Toradol  in the ED.  Her pain is well-controlled.  She was discharged home in good condition.  She was given prescriptions for short course of Vicodin, Zofran  and Flomax .  She was given information about following up with urology.  Return precautions were given.  (Labs, imaging, consults)  Labs: I Ordered, and personally interpreted labs.  The pertinent results include: Urinalysis shows hematuria, normal creatinine  Imaging Studies ordered: I ordered imaging studies  including CT abdomen pelvis I independently visualized and interpreted imaging. I agree with the radiologist interpretation  Additional history obtained from husband.  External records from outside source obtained and reviewed including prior notes  Cardiac Monitoring: The patient was maintained on a cardiac monitor.  If on the cardiac monitor, I personally viewed and interpreted the cardiac monitored which showed an underlying rhythm of: Sinus rhythm  Reevaluation: After the interventions noted above, I reevaluated the patient and found that they have :improved  Social Determinants of Health:    Disposition: Discharged to home  Co morbidities that complicate the patient evaluation  Past Medical History:  Diagnosis Date   No pertinent past medical history    PP care - s/p C/S 12/26 12/13/2011   Preterm contractions 11/19/2011   Preterm labor    12/2009 34wks   Sjogren's disease (HCC)    Vitamin D  deficiency      Medicines Meds ordered this encounter  Medications   ketorolac  (TORADOL ) 15 MG/ML injection 15 mg   HYDROcodone -acetaminophen  (NORCO/VICODIN) 5-325 MG tablet    Sig: Take 1-2 tablets by mouth every 4 (four) hours as needed.    Dispense:  15 tablet    Refill:  0   ondansetron  (ZOFRAN -ODT) 4 MG disintegrating tablet    Sig: 4mg  ODT q4 hours prn nausea/vomit    Dispense:  8 tablet    Refill:  0   tamsulosin  (FLOMAX ) 0.4 MG CAPS capsule    Sig: Take 1 capsule (0.4 mg total) by mouth daily.    Dispense:  10 capsule    Refill:  0    I have reviewed the patients home medicines and have made adjustments as needed  Problem List / ED Course: Problem List Items Addressed This Visit   None Visit Diagnoses       Kidney stone    -  Primary   Relevant Medications   HYDROcodone -acetaminophen  (NORCO/VICODIN) 5-325 MG tablet                Final diagnoses:  Kidney stone    ED Discharge Orders          Ordered    HYDROcodone -acetaminophen   (NORCO/VICODIN) 5-325 MG tablet  Every 4 hours PRN        07/12/24 2304    ondansetron  (ZOFRAN -ODT) 4 MG disintegrating tablet        07/12/24 2304    tamsulosin  (FLOMAX ) 0.4 MG CAPS capsule  Daily        07/12/24 2304  Lenor Hollering, MD 07/12/24 [phone]

## 2024-07-12 NOTE — ED Triage Notes (Signed)
 Pt POV reporting pelvic pain and R side flank pain that began today, not improving with pain meds. Denies n/v, normal BM.

## 2024-09-03 ENCOUNTER — Ambulatory Visit: Payer: Self-pay

## 2024-09-03 NOTE — Telephone Encounter (Signed)
 FYI Only or Action Required?: FYI only for provider.  Patient was last seen in primary care on 03/05/2023 by Joesph Annabella HERO, FNP.  Called Nurse Triage reporting Diarrhea.  Symptoms began 2 weeks ago.  Symptoms are: unchanged.  Triage Disposition: See PCP When Office is Open (Within 3 Days)  Patient/caregiver understands and will follow disposition?: Yes      Copied from CRM [id number]. Topic: Clinical - Red Word Triage >> Sep 03, 2024  4:19 PM [place] B wrote: Patient has chills and diarrhea       Reason for Disposition  [1] MILD diarrhea (e.g., 1-3 or more stools than normal in past 24 hours) AND [2] present >  7 days  (Exception: Chronic diarrhea that is not worse.)  Answer Assessment - Initial Assessment Questions 1. DIARRHEA SEVERITY: How bad is the diarrhea? How many more stools have you had in the past 24 hours than normal?      Patient has had diarrhea 3x in the last 2 weeks 2. ONSET: When did the diarrhea begin?      2 weeks, last time was last night  3. STOOL DESCRIPTION:  How loose or watery is the diarrhea? What is the stool color? Is there any blood or mucous in the stool?     Mushy with an orange color 4. VOMITING: Are you also vomiting? If Yes, ask: How many times in the past 24 hours?      No 5. ABDOMEN PAIN: Are you having any abdomen pain? If Yes, ask: What does it feel like? (e.g., crampy, dull, intermittent, constant)      No 6. ABDOMEN PAIN SEVERITY: If present, ask: How bad is the pain?  (e.g., Scale 1-10; mild, moderate, or severe)     0/10 7. ORAL INTAKE: If vomiting, Have you been able to drink liquids? How much liquids have you had in the past 24 hours?     N/A 8. HYDRATION: Any signs of dehydration? (e.g., dry mouth [not just dry lips], too weak to stand, dizziness, new weight loss) When did you last urinate?     Not dehydrated  9. EXPOSURE: Have you traveled to a foreign country recently? Have you been exposed  to anyone with diarrhea? Could you have eaten any food that was spoiled?     No 10. ANTIBIOTIC USE: Are you taking antibiotics now or have you taken antibiotics in the past 2 months?       No 11. OTHER SYMPTOMS: Do you have any other symptoms? (e.g., fever, blood in stool)       Chills, fatigue after eating  Protocols used: Diarrhea-A-AH

## 2024-09-04 ENCOUNTER — Encounter: Payer: Self-pay | Admitting: Family

## 2024-09-04 ENCOUNTER — Ambulatory Visit (INDEPENDENT_AMBULATORY_CARE_PROVIDER_SITE_OTHER): Admitting: Family

## 2024-09-04 VITALS — BP 122/76 | HR 70 | Temp 98.1°F | Ht 63.0 in | Wt 127.0 lb

## 2024-09-04 DIAGNOSIS — E162 Hypoglycemia, unspecified: Secondary | ICD-10-CM

## 2024-09-04 DIAGNOSIS — R197 Diarrhea, unspecified: Secondary | ICD-10-CM

## 2024-09-04 DIAGNOSIS — G4710 Hypersomnia, unspecified: Secondary | ICD-10-CM | POA: Diagnosis not present

## 2024-09-04 DIAGNOSIS — Z0001 Encounter for general adult medical examination with abnormal findings: Secondary | ICD-10-CM

## 2024-09-04 DIAGNOSIS — Z0000 Encounter for general adult medical examination without abnormal findings: Secondary | ICD-10-CM | POA: Diagnosis not present

## 2024-09-04 DIAGNOSIS — R450 Nervousness: Secondary | ICD-10-CM

## 2024-09-04 DIAGNOSIS — R5383 Other fatigue: Secondary | ICD-10-CM | POA: Diagnosis not present

## 2024-09-04 LAB — BAYER DCA HB A1C WAIVED: HB A1C (BAYER DCA - WAIVED): 5 % (ref 4.8–5.6)

## 2024-09-04 NOTE — Patient Instructions (Addendum)
 Diarrhea, Adult Diarrhea is frequent loose and sometimes watery bowel movements. Diarrhea can make you feel weak and cause you to become dehydrated. Dehydration is a condition in which there is not enough water or other fluids in the body. Dehydration can make you tired and thirsty, cause you to have a dry mouth, and decrease how often you urinate. Diarrhea typically lasts 2-3 days. However, it can last longer if it is a sign of something more serious. It is important to treat your diarrhea as told by your health care provider. Follow these instructions at home: Eating and drinking     Follow these recommendations as told by your health care provider: Take an oral rehydration solution (ORS). This is an over-the-counter medicine that helps return your body to its normal balance of nutrients and water. It is found at pharmacies and retail stores. Drink enough fluid to keep your urine pale yellow. Drink fluids such as water, diluted fruit juice, and low-calorie sports drinks. You can drink milk also, if desired. Sucking on ice chips is another way to get fluids. Avoid drinking fluids that contain a lot of sugar or caffeine, such as soda, energy drinks, and regular sports drinks. Avoid alcohol. Eat bland, easy-to-digest foods in small amounts as you are able. These foods include bananas, applesauce, rice, lean meats, toast, and crackers. Avoid spicy or fatty foods.  Medicines Take over-the-counter and prescription medicines only as told by your health care provider. If you were prescribed antibiotics, take them as told by your health care provider. Do not stop using the antibiotic even if you start to feel better. General instructions  Wash your hands often using soap and water for at least 20 seconds. If soap and water are not available, use hand sanitizer. Others in the household should wash their hands as well. Hands should be washed: After using the toilet or changing a diaper. Before  preparing, cooking, or serving food. While caring for a sick person or while visiting someone in a hospital. Rest at home while you recover. Take a warm bath to relieve any burning or pain from frequent diarrhea episodes. Watch your condition for any changes. Contact a health care provider if: You have a fever. Your diarrhea gets worse. You have new symptoms. You vomit every time you eat or drink. You feel light-headed, dizzy, or have a headache. You have muscle cramps. You have signs of dehydration, such as: Dark urine, very little urine, or no urine. Cracked lips. Dry mouth. Sunken eyes. Sleepiness. Weakness. You have bloody or black stools or stools that look like tar. You have severe pain, cramping, or bloating in your abdomen. Your skin feels cold and clammy. You feel confused. Get help right away if: You have chest pain or your heart is beating very quickly. You have trouble breathing or you are breathing very quickly. You feel extremely weak or you faint. These symptoms may be an emergency. Get help right away. Call 911. Do not wait to see if the symptoms will go away. Do not drive yourself to the hospital. This information is not intended to replace advice given to you by your health care provider. Make sure you discuss any questions you have with your health care provider.   Hypoglycemia Hypoglycemia is when the amount of sugar, or glucose, in your blood is too low. Low blood sugar can happen if you have diabetes or if you don't have diabetes. It may be an emergency. What are the causes? Low blood sugar happens  most often in people who have diabetes. It may be caused by: Diabetes medicine. Not eating enough, or not eating often enough. Being more active than normal. If you don't have diabetes, you may still get low blood sugar if: There's a tumor in your pancreas. A tumor is a growth of cells that isn't normal. You don't eat enough, or you fast. Fasting is when you  don't eat for long periods at a time. You have a bad infection or illness. You have problems after weight loss surgery. You have kidney or liver problems. You take certain medicines. What increases the risk? You're more likely to have low blood sugar if: You have diabetes and take medicine for it. You drink a lot of alcohol. You get sick. What are the signs or symptoms? Mild Hunger or feeling like you may vomit. Sweating and feeling cold to the touch. Feeling dizzy or light-headed. Being sleepy or having trouble sleeping. A headache. Blurry vision. Mood changes. These include feeling worried, nervous, or easily annoyed. Moderate Feeling confused. Changes in the way you act. Weakness. An uneven heartbeat. Very bad Having very low blood sugar is an emergency. It can cause: Fainting. Seizures. A coma. Death. How is this diagnosed?  Low blood sugar can be found with a blood test. This test tells you how much sugar is in your blood. It's done while you're having symptoms. Your health care provider may also do an exam and look at your medical history. How is this treated? Treating low blood sugar If you have low blood sugar, eat or drink something with sugar in it right away. The food or drink should have 15 grams of a fast-acting carbohydrate (carb). Options include: 4 oz (120 mL) of fruit juice. 4 oz (120 mL) of soda (not diet soda). A few pieces of hard candy. Check food labels to see how many pieces to eat. 1 Tbsp (15 mL) of sugar or honey. 4 glucose tablets. 1 tube of glucose gel. Treating low blood sugar if you have diabetes Talk with your provider about how much carb you should take. If you're alert and can swallow safely, you may follow the 15:15 rule: Take 15 grams of a fast-acting carb. Check your blood sugar 15 minutes after you take the carb. If your blood sugar is still at or below 70 mg/dL (3.9 mmol/L), take 15 grams of a carb again. If your blood sugar doesn't  go above 70 mg/dL (3.9 mmol/L) after 3 tries, get help right away. After your blood sugar goes back to normal, eat a meal or a snack within 1 hour. Always keep 15 grams of a fast-acting carb with you. This could be: 4 glucose tablets. A few pieces of hard candy. 1 Tbsp (15 mL) of honey or sugar. 1 tube of glucose gel. Treating very low blood sugar If your blood sugar is less than 54 mg/dL (3 mmol/L), it's an emergency. Get help right away. If you can't eat or drink, you will need to be given glucagon. A family member or friend should learn how to check your blood sugar and give you glucagon. Ask your provider if you should keep a glucagon kit at home. You may also need to be treated in a hospital. Follow these instructions at home: If you have diabetes: Always keep a fast-acting carb (15 grams) with you. Follow your diabetes care plan. Make sure you: Know the symptoms of low blood sugar. Check your blood sugar as often as told. Always check it  before and after you exercise. Always check your blood sugar before you drive. Take your medicines as told. Eat on time. Do not skip meals. Share your diabetes care plan with: Your work or school. The people you live with. Wear an alert bracelet or carry a card that says you have diabetes. General instructions If you drink alcohol: Limit how much you have to: 0-1 drink a day if you're female. 0-2 drinks a day if you're female. Know how much alcohol is in your drink. In the [place] one drink is one 12 oz bottle of beer (355 mL), one 5 oz glass of wine (148 mL), or one 1 oz glass of hard liquor (44 mL). Be sure to eat food when you drink alcohol. Be sure to check your blood sugar after you drink. Alcohol may lead to low blood sugar later. Where to find more information American Diabetes Association (ADA): diabetes.org Contact a health care provider if: You have low blood sugar often. You have diabetes and are having trouble keeping your blood  sugar in the right range. Get help right away if: You can't get your blood sugar above 70 mg/dL (3.9 mmol/L) after 3 tries. Your blood sugar is below 54 mg/dL (3 mmol/L). You have a seizure. You faint. These symptoms may be an emergency. Call 911 right away. Do not wait to see if the symptoms will go away. Do not drive yourself to the hospital. This information is not intended to replace advice given to you by your health care provider. Make sure you discuss any questions you have with your health care provider. Document Revised: 09/06/2023 Document Reviewed: 02/21/2023 Elsevier Patient Education  2024 Elsevier Inc. Document Revised: 05/23/2022 Document Reviewed: 05/23/2022 Elsevier Patient Education  2024 [employer].

## 2024-09-04 NOTE — Progress Notes (Signed)
 Subjective:    Patient ID: Veronica Hayes, female    DOB: [birth year], 38 y.o.   MRN: [id number]  Chief Complaint  Patient presents with   Diarrhea    FOR 2 WEEKS   PT presents to the office today for CPE and with diarrhea three times over the last two weeks. Reports she had similar symptoms two years ago and worried it is going to get worse to back it was.   Reports she can eat certain sweet foods and she will hot flashes, urgency to go to the bathroom,  nausea, and diarrhea.   Reports after eating she can not keep her eyes open. Reports low glucose at times. Has had two low readings in EPIC over the last several years in the 60's.  She has completed a sleep study that was normal. Negative for OSA and narcolepsy.  Diarrhea  This is a recurrent problem. The current episode started 1 to 4 weeks ago. The problem has been waxing and waning. Pertinent negatives include no bloating, fever, headaches or increased  flatus. Exacerbated by: sugary foods. The treatment provided mild relief. There is no history of malabsorption.      Review of Systems  Constitutional:  Negative for fever.  Gastrointestinal:  Positive for diarrhea. Negative for bloating and flatus.  Neurological:  Negative for headaches.  All other systems reviewed and are negative.   Social History   Socioeconomic History   Marital status: Married    Spouse name: Not on file   Number of children: Not on file   Years of education: Not on file   Highest education level: Not on file  Occupational History   Not on file  Tobacco Use   Smoking status: Never   Smokeless tobacco: Never  Vaping Use   Vaping status: Never Used  Substance and Sexual Activity   Alcohol use: No   Drug use: No   Sexual activity: Yes  Other Topics Concern   Not on file  Social History Narrative   Caffiene 1 coffee dailym one can soda daily..  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  Constitutional:      General: She is not in acute distress.    Appearance: She is well-developed.  HENT:     Head: Normocephalic and atraumatic.     Right Ear: Tympanic membrane normal.     Left Ear: Tympanic membrane normal.  Eyes:     Pupils: Pupils are equal, round, and reactive to light.  Neck:     Thyroid : No thyromegaly.  Cardiovascular:     Rate and Rhythm: Normal rate and regular rhythm.     Heart sounds: Normal heart sounds. No murmur heard. Pulmonary:     Effort: Pulmonary effort is normal. No respiratory distress.     Breath sounds: Normal breath sounds. No wheezing.  Abdominal:  General: Bowel sounds are normal. There is no distension.     Palpations: Abdomen is soft.     Tenderness: There is no abdominal tenderness.  Musculoskeletal:        General: No tenderness. Normal range of motion.     Cervical back: Normal range of motion and neck supple.  Skin:    General: Skin is warm and dry.  Neurological:     Mental Status: She is alert and oriented to person, place, and time.     Cranial Nerves: No cranial nerve deficit.     Deep Tendon Reflexes: Reflexes are normal and symmetric.  Psychiatric:        Mood and Affect: Affect is tearful.        Behavior: Behavior normal.        Thought Content: Thought content normal.        Judgment: Judgment normal.       There were no vitals taken for this visit.     Assessment & Plan:  Veronica Hayes comes in today with  chief complaint of Diarrhea (FOR 2 WEEKS)   Diagnosis and orders addressed:  1. Diarrhea, unspecified type - CMP14+EGFR - Ambulatory referral to Gastroenterology  2. Jittery - CMP14+EGFR  3. Low glucose level - Bayer DCA Hb A1c Waived - CMP14+EGFR  4. Excessive sleepiness - CMP14+EGFR - Thyroid  Panel With TSH - VITAMIN D  25 Hydroxy (Vit-D Deficiency, Fractures) - Anemia Profile B  5. Annual physical exam (Primary) - Bayer DCA Hb A1c Waived - CMP14+EGFR - Lipid panel - Thyroid  Panel With TSH - VITAMIN D  25 Hydroxy (Vit-D Deficiency, Fractures) - Anemia Profile B  6. Other fatigue - CMP14+EGFR - Thyroid  Panel With TSH - VITAMIN D  25 Hydroxy (Vit-D Deficiency, Fractures) - Anemia Profile B   Labs pending Libre placed today to monitor next 14 days to see if she having lows or highs related to her meals.  Referral to GI placed.  Follow up in 2 weeks to check herlene  Return if symptoms worsen or fail to improve.    Bari Learn, FNP

## 2024-09-05 ENCOUNTER — Ambulatory Visit: Payer: Self-pay | Admitting: Family

## 2024-09-05 LAB — LIPID PANEL
Chol/HDL Ratio: 3 ratio (ref 0.0–4.4)
Cholesterol, Total: 186 mg/dL (ref 100–199)
HDL: 63 mg/dL (ref >39)
LDL Chol Calc (NIH): 110 mg/dL — ABNORMAL HIGH (ref 0–99)
Triglycerides: 72 mg/dL (ref 0–149)
VLDL Cholesterol Cal: 13 mg/dL (ref 5–40)

## 2024-09-05 LAB — ANEMIA PROFILE B
Basophils Absolute: 0 x10E3/uL (ref 0.0–0.2)
Basos: 1 %
EOS (ABSOLUTE): 0.1 x10E3/uL (ref 0.0–0.4)
Eos: 2 %
Ferritin: 58 ng/mL (ref 15–150)
Folate: 8.4 ng/mL (ref >3.0)
Hematocrit: 42.4 % (ref 34.0–46.6)
Hemoglobin: 14 g/dL (ref 11.1–15.9)
Immature Grans (Abs): 0 x10E3/uL (ref 0.0–0.1)
Immature Granulocytes: 0 %
Iron Saturation: 34 % (ref 15–55)
Iron: 115 ug/dL (ref 27–159)
Lymphocytes Absolute: 1.6 x10E3/uL (ref 0.7–3.1)
Lymphs: 34 %
MCH: 29.9 pg (ref 26.6–33.0)
MCHC: 33 g/dL (ref 31.5–35.7)
MCV: 91 fL (ref 79–97)
Monocytes Absolute: 0.4 x10E3/uL (ref 0.1–0.9)
Monocytes: 9 %
Neutrophils Absolute: 2.5 x10E3/uL (ref 1.4–7.0)
Neutrophils: 54 %
Platelets: 260 x10E3/uL (ref 150–450)
RBC: 4.68 x10E6/uL (ref 3.77–5.28)
RDW: 12.1 % (ref 11.7–15.4)
Retic Ct Pct: 1.5 % (ref 0.6–2.6)
Total Iron Binding Capacity: 340 ug/dL (ref 250–450)
UIBC: 225 ug/dL (ref 131–425)
Vitamin B-12: 344 pg/mL (ref 232–1245)
WBC: 4.6 x10E3/uL (ref 3.4–10.8)

## 2024-09-05 LAB — CMP14+EGFR
ALT: 14 IU/L (ref 0–32)
AST: 14 IU/L (ref 0–40)
Albumin: 4.8 g/dL (ref 3.9–4.9)
Alkaline Phosphatase: 45 IU/L (ref 41–116)
BUN/Creatinine Ratio: 15 (ref 9–23)
BUN: 12 mg/dL (ref 6–20)
Bilirubin Total: 0.4 mg/dL (ref 0.0–1.2)
CO2: 25 mmol/L (ref 20–29)
Calcium: 9.7 mg/dL (ref 8.7–10.2)
Chloride: 103 mmol/L (ref 96–106)
Creatinine, Ser: 0.8 mg/dL (ref 0.57–1.00)
Globulin, Total: 2.6 g/dL (ref 1.5–4.5)
Glucose: 82 mg/dL (ref 70–99)
Potassium: 4.7 mmol/L (ref 3.5–5.2)
Sodium: 139 mmol/L (ref 134–144)
Total Protein: 7.4 g/dL (ref 6.0–8.5)
eGFR: 96 mL/min/1.73 (ref >59)

## 2024-09-05 LAB — THYROID PANEL WITH TSH
Free Thyroxine Index: 1.8 (ref 1.2–4.9)
T3 Uptake Ratio: 29 % (ref 24–39)
T4, Total: 6.2 ug/dL (ref 4.5–12.0)
TSH: 1.15 u[IU]/mL (ref 0.450–4.500)

## 2024-09-05 LAB — VITAMIN D 25 HYDROXY (VIT D DEFICIENCY, FRACTURES): Vit D, 25-Hydroxy: 24.9 ng/mL — AB (ref 30.0–100.0)

## 2024-09-05 MED ORDER — VITAMIN D (ERGOCALCIFEROL) 1.25 MG (50000 UNIT) PO CAPS: 50000.0000 [IU] | ORAL_CAPSULE | ORAL | 3 refills | Status: AC

## 2024-09-11 ENCOUNTER — Telehealth: Payer: Self-pay

## 2024-09-11 NOTE — Telephone Encounter (Signed)
 Called and left detailed message okay for patient to take off her self

## 2024-09-11 NOTE — Telephone Encounter (Signed)
 Copied from CRM [id number]. Topic: Appointments - Scheduling Inquiry for Clinic >> Sep 11, 2024  9:52 AM Tobias CROME wrote: Reason for CRM: Patient was scheduled for a 2 week follow up on 09/18/2024. Patient will be out of town and had to reschedule, patient states a libre sensor was placed on her and she was supposed to come back to get it taken off after 2 weeks.  Patient inquiring if okay to take if off herself as she will not be able to come in on 10/02.   Requesting callback: [phone]

## 2024-09-18 ENCOUNTER — Ambulatory Visit: Admitting: Family

## 2024-10-06 ENCOUNTER — Ambulatory Visit: Admitting: Family

## 2024-10-07 ENCOUNTER — Encounter: Payer: Self-pay | Admitting: Family

## 2024-10-07 ENCOUNTER — Ambulatory Visit (INDEPENDENT_AMBULATORY_CARE_PROVIDER_SITE_OTHER): Admitting: Family

## 2024-10-07 VITALS — BP 117/77 | HR 77 | Temp 98.3°F | Ht 63.0 in | Wt 135.2 lb

## 2024-10-07 DIAGNOSIS — R197 Diarrhea, unspecified: Secondary | ICD-10-CM | POA: Diagnosis not present

## 2024-10-07 DIAGNOSIS — E162 Hypoglycemia, unspecified: Secondary | ICD-10-CM

## 2024-10-07 NOTE — Patient Instructions (Signed)
 Hypoglycemia Hypoglycemia is when the amount of sugar, or glucose, in your blood is too low. Low blood sugar can happen if you have diabetes or if you don't have diabetes. It may be an emergency. What are the causes? Low blood sugar happens most often in people who have diabetes. It may be caused by: Diabetes medicine. Not eating enough, or not eating often enough. Being more active than normal. If you don't have diabetes, you may still get low blood sugar if: There's a tumor in your pancreas. A tumor is a growth of cells that isn't normal. You don't eat enough, or you fast. Fasting is when you don't eat for long periods at a time. You have a bad infection or illness. You have problems after weight loss surgery. You have kidney or liver problems. You take certain medicines. What increases the risk? You're more likely to have low blood sugar if: You have diabetes and take medicine for it. You drink a lot of alcohol . You get sick. What are the signs or symptoms? Mild Hunger or feeling like you may vomit. Sweating and feeling cold to the touch. Feeling dizzy or light-headed. Being sleepy or having trouble sleeping. A headache. Blurry vision. Mood changes. These include feeling worried, nervous, or easily annoyed. Moderate Feeling confused. Changes in the way you act. Weakness. An uneven heartbeat. Very bad Having very low blood sugar is an emergency. It can cause: Fainting. Seizures. A coma. Death. How is this diagnosed?  Low blood sugar can be found with a blood test. This test tells you how much sugar is in your blood. It's done while you're having symptoms. Your health care provider may also do an exam and look at your medical history. How is this treated? Treating low blood sugar If you have low blood sugar, eat or drink something with sugar in it right away. The food or drink should have 15 grams of a fast-acting carbohydrate (carb). Options include: 4 oz (120 mL) of  fruit juice. 4 oz (120 mL) of soda (not diet soda). A few pieces of hard candy. Check food labels to see how many pieces to eat. 1 Tbsp (15 mL) of sugar or honey. 4 glucose tablets. 1 tube of glucose gel. Treating low blood sugar if you have diabetes Talk with your provider about how much carb you should take. If you're alert and can swallow safely, you may follow the 15:15 rule: Take 15 grams of a fast-acting carb. Check your blood sugar 15 minutes after you take the carb. If your blood sugar is still at or below 70 mg/dL (3.9 mmol/L), take 15 grams of a carb again. If your blood sugar doesn't go above 70 mg/dL (3.9 mmol/L) after 3 tries, get help right away. After your blood sugar goes back to normal, eat a meal or a snack within 1 hour. Always keep 15 grams of a fast-acting carb with you. This could be: 4 glucose tablets. A few pieces of hard candy. 1 Tbsp (15 mL) of honey or sugar. 1 tube of glucose gel. Treating very low blood sugar If your blood sugar is less than 54 mg/dL (3 mmol/L), it's an emergency. Get help right away. If you can't eat or drink, you will need to be given glucagon. A family member or friend should learn how to check your blood sugar and give you glucagon. Ask your provider if you should keep a glucagon kit at home. You may also need to be treated in a hospital. Follow  these instructions at home: If you have diabetes: Always keep a fast-acting carb (15 grams) with you. Follow your diabetes care plan. Make sure you: Know the symptoms of low blood sugar. Check your blood sugar as often as told. Always check it before and after you exercise. Always check your blood sugar before you drive. Take your medicines as told. Eat on time. Do not skip meals. Share your diabetes care plan with: Your work or school. The people you live with. Wear an alert bracelet or carry a card that says you have diabetes. General instructions If you drink alcohol : Limit how much you  have to: 0-1 drink a day if you're female. 0-2 drinks a day if you're female. Know how much alcohol  is in your drink. In the [place] one drink is one 12 oz bottle of beer (355 mL), one 5 oz glass of wine (148 mL), or one 1 oz glass of hard liquor (44 mL). Be sure to eat food when you drink alcohol . Be sure to check your blood sugar after you drink. Alcohol  may lead to low blood sugar later. Where to find more information American Diabetes Association (ADA): diabetes.org Contact a health care provider if: You have low blood sugar often. You have diabetes and are having trouble keeping your blood sugar in the right range. Get help right away if: You can't get your blood sugar above 70 mg/dL (3.9 mmol/L) after 3 tries. Your blood sugar is below 54 mg/dL (3 mmol/L). You have a seizure. You faint. These symptoms may be an emergency. Call 911 right away. Do not wait to see if the symptoms will go away. Do not drive yourself to the hospital. This information is not intended to replace advice given to you by your health care provider. Make sure you discuss any questions you have with your health care provider. Document Revised: 09/06/2023 Document Reviewed: 02/21/2023 Elsevier Patient Education  2024 [employer].

## 2024-10-07 NOTE — Progress Notes (Signed)
 Subjective:    Patient ID: Veronica Hayes, female    DOB: Aug 01, 1986, 38 y.o.   MRN: [id number]  Chief Complaint  Patient presents with   Follow-up   PT presents to the office today to follow up on libre readings.  Reports after eating sweet foods five hours later she will feel chills, trembling, palpitations, diarrhea. Then feel fine.   She reports during every one of these episodes her glucose would be in the 50's. She states she has now started eating a small snack prior to bed.  Diarrhea  This is a chronic problem.      Review of Systems  Gastrointestinal:  Positive for diarrhea.  All other systems reviewed and are negative.   Social History   Socioeconomic History   Marital status: Married    Spouse name: Not on file   Number of children: Not on file   Years of education: Not on file   Highest education level: Not on file  Occupational History   Not on file  Tobacco Use   Smoking status: Never   Smokeless tobacco: Never  Vaping Use   Vaping status: Never Used  Substance and Sexual Activity   Alcohol use: No   Drug use: No   Sexual activity: Yes  Other Topics Concern   Not on file  Social History Narrative   Caffiene 1 coffee dailym one can soda daily..  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  Constitutional:      General: She is not in acute distress.    Appearance: She is well-developed.  HENT:     Head:  Normocephalic and atraumatic.     Right Ear: Tympanic membrane normal.     Left Ear: Tympanic membrane normal.  Eyes:     Pupils: Pupils are equal, round, and reactive to light.  Neck:     Thyroid : No thyromegaly.  Cardiovascular:     Rate and Rhythm: Normal rate and regular rhythm.     Heart sounds: Normal heart sounds. No murmur heard. Pulmonary:     Effort: Pulmonary effort is normal. No respiratory distress.     Breath sounds: Normal breath sounds. No wheezing.  Abdominal:     General: Bowel sounds are normal. There is no distension.     Palpations: Abdomen is soft.     Tenderness: There is no abdominal tenderness.  Musculoskeletal:        General: No tenderness. Normal range of motion.     Cervical back: Normal range of motion and neck supple.  Skin:    General: Skin is warm and dry.  Neurological:     Mental Status: She is alert and oriented to person, place, and time.     Cranial Nerves: No cranial nerve deficit.     Deep Tendon Reflexes: Reflexes are normal and symmetric.  Psychiatric:  Behavior: Behavior normal.        Thought Content: Thought content normal.        Judgment: Judgment normal.       BP 117/77   Pulse 77   Temp 98.3 F (36.8 C) (Temporal)   Ht 5' 3 (1.6 m)   Wt 135 lb 3.2 oz (61.3 kg)   SpO2 97%   BMI 23.95 kg/m      Assessment & Plan:  Veronica Hayes comes in today with chief complaint of Follow-up   Diagnosis and orders addressed:  1. Hypoglycemia (Primary) Given lows in 50's during these episodes will do referral to Endocrinologists for further testing Encouraged small frequent meals Labs reivewed  Get glucose tablets to keep in purse and bedside table to take during episodes  - Ambulatory referral to Endocrinology  2. Diarrhea, unspecified type Keep follow up with GI    Bari Learn, FNP

## 2024-11-06 ENCOUNTER — Encounter (INDEPENDENT_AMBULATORY_CARE_PROVIDER_SITE_OTHER): Payer: Self-pay | Admitting: *Deleted

## 2025-02-11 ENCOUNTER — Ambulatory Visit: Admitting: Nurse Practitioner
